# Patient Record
Sex: Male | Born: 1960 | Race: White | Hispanic: No | Marital: Married | Smoking: Never smoker
Health system: Southern US, Community
[De-identification: ages and names within clinical notes are randomized; demographics above are authoritative.]

## PROBLEM LIST (undated history)

## (undated) DIAGNOSIS — Z86010 Personal history of colonic polyps: Principal | ICD-10-CM

## (undated) DIAGNOSIS — D649 Anemia, unspecified: Secondary | ICD-10-CM

## (undated) DIAGNOSIS — K5790 Diverticulosis of intestine, part unspecified, without perforation or abscess without bleeding: Secondary | ICD-10-CM

## (undated) DIAGNOSIS — Z8739 Personal history of other diseases of the musculoskeletal system and connective tissue: Secondary | ICD-10-CM

## (undated) DIAGNOSIS — G709 Myoneural disorder, unspecified: Secondary | ICD-10-CM

## (undated) DIAGNOSIS — K648 Other hemorrhoids: Secondary | ICD-10-CM

## (undated) DIAGNOSIS — T7840XA Allergy, unspecified, initial encounter: Secondary | ICD-10-CM

## (undated) DIAGNOSIS — M009 Pyogenic arthritis, unspecified: Secondary | ICD-10-CM

## (undated) DIAGNOSIS — E119 Type 2 diabetes mellitus without complications: Secondary | ICD-10-CM

## (undated) DIAGNOSIS — Z5189 Encounter for other specified aftercare: Secondary | ICD-10-CM

## (undated) DIAGNOSIS — C801 Malignant (primary) neoplasm, unspecified: Secondary | ICD-10-CM

## (undated) DIAGNOSIS — S63279A Dislocation of unspecified interphalangeal joint of unspecified finger, initial encounter: Secondary | ICD-10-CM

## (undated) DIAGNOSIS — D369 Benign neoplasm, unspecified site: Secondary | ICD-10-CM

## (undated) DIAGNOSIS — N200 Calculus of kidney: Secondary | ICD-10-CM

## (undated) HISTORY — PX: KNEE ARTHROSCOPY W/ ACL RECONSTRUCTION: SHX1858

## (undated) HISTORY — DX: Personal history of colonic polyps: Z86.010

## (undated) HISTORY — DX: Encounter for other specified aftercare: Z51.89

## (undated) HISTORY — DX: Anemia, unspecified: D64.9

## (undated) HISTORY — DX: Diverticulosis of intestine, part unspecified, without perforation or abscess without bleeding: K57.90

## (undated) HISTORY — PX: KIDNEY STONE SURGERY: SHX686

## (undated) HISTORY — DX: Other hemorrhoids: K64.8

## (undated) HISTORY — DX: Benign neoplasm, unspecified site: D36.9

## (undated) HISTORY — PX: COLONOSCOPY: SHX174

## (undated) HISTORY — DX: Personal history of other diseases of the musculoskeletal system and connective tissue: Z87.39

## (undated) HISTORY — PX: KNEE ARTHROSCOPY: SUR90

## (undated) HISTORY — DX: Dislocation of unspecified interphalangeal joint of unspecified finger, initial encounter: S63.279A

## (undated) HISTORY — DX: Allergy, unspecified, initial encounter: T78.40XA

## (undated) HISTORY — DX: Type 2 diabetes mellitus without complications: E11.9

## (undated) HISTORY — DX: Malignant (primary) neoplasm, unspecified: C80.1

## (undated) HISTORY — DX: Myoneural disorder, unspecified: G70.9

## (undated) HISTORY — DX: Calculus of kidney: N20.0

## (undated) HISTORY — DX: Pyogenic arthritis, unspecified: M00.9

---

## 1998-06-12 ENCOUNTER — Ambulatory Visit (HOSPITAL_BASED_OUTPATIENT_CLINIC_OR_DEPARTMENT_OTHER): Admission: RE | Admit: 1998-06-12 | Discharge: 1998-06-12 | Payer: Self-pay | Admitting: Orthopaedic Surgery

## 1999-10-17 ENCOUNTER — Ambulatory Visit (HOSPITAL_COMMUNITY): Admission: RE | Admit: 1999-10-17 | Discharge: 1999-10-17 | Payer: Self-pay | Admitting: Gastroenterology

## 1999-10-17 ENCOUNTER — Encounter (INDEPENDENT_AMBULATORY_CARE_PROVIDER_SITE_OTHER): Payer: Self-pay | Admitting: Specialist

## 1999-10-17 DIAGNOSIS — Z860101 Personal history of adenomatous and serrated colon polyps: Secondary | ICD-10-CM

## 1999-10-17 DIAGNOSIS — Z86010 Personal history of colonic polyps: Secondary | ICD-10-CM

## 1999-10-17 HISTORY — DX: Personal history of adenomatous and serrated colon polyps: Z86.0101

## 1999-10-17 HISTORY — DX: Personal history of colonic polyps: Z86.010

## 2001-01-29 ENCOUNTER — Encounter: Admission: RE | Admit: 2001-01-29 | Discharge: 2001-01-29 | Payer: Self-pay | Admitting: Internal Medicine

## 2001-11-25 ENCOUNTER — Encounter (INDEPENDENT_AMBULATORY_CARE_PROVIDER_SITE_OTHER): Payer: Self-pay | Admitting: Specialist

## 2001-11-25 ENCOUNTER — Inpatient Hospital Stay (HOSPITAL_COMMUNITY): Admission: EM | Admit: 2001-11-25 | Discharge: 2001-11-26 | Payer: Self-pay | Admitting: Gastroenterology

## 2001-11-25 ENCOUNTER — Encounter: Payer: Self-pay | Admitting: Gastroenterology

## 2001-12-01 ENCOUNTER — Encounter: Payer: Self-pay | Admitting: Gastroenterology

## 2001-12-01 ENCOUNTER — Ambulatory Visit (HOSPITAL_COMMUNITY): Admission: RE | Admit: 2001-12-01 | Discharge: 2001-12-01 | Payer: Self-pay | Admitting: Gastroenterology

## 2003-10-30 ENCOUNTER — Other Ambulatory Visit: Admission: RE | Admit: 2003-10-30 | Discharge: 2003-10-30 | Payer: Self-pay | Admitting: Dermatology

## 2004-01-15 ENCOUNTER — Encounter: Admission: RE | Admit: 2004-01-15 | Discharge: 2004-01-15 | Payer: Self-pay | Admitting: Rheumatology

## 2004-12-23 ENCOUNTER — Emergency Department (HOSPITAL_COMMUNITY): Admission: EM | Admit: 2004-12-23 | Discharge: 2004-12-24 | Payer: Self-pay | Admitting: Emergency Medicine

## 2005-03-03 ENCOUNTER — Ambulatory Visit: Payer: Self-pay | Admitting: Gastroenterology

## 2005-03-18 ENCOUNTER — Ambulatory Visit: Payer: Self-pay | Admitting: Internal Medicine

## 2007-02-11 ENCOUNTER — Emergency Department (HOSPITAL_COMMUNITY): Admission: EM | Admit: 2007-02-11 | Discharge: 2007-02-11 | Payer: Self-pay | Admitting: Emergency Medicine

## 2007-02-23 ENCOUNTER — Ambulatory Visit (HOSPITAL_COMMUNITY): Admission: RE | Admit: 2007-02-23 | Discharge: 2007-02-23 | Payer: Self-pay | Admitting: Urology

## 2007-08-05 ENCOUNTER — Ambulatory Visit: Payer: Self-pay | Admitting: Internal Medicine

## 2009-03-26 ENCOUNTER — Ambulatory Visit: Payer: Self-pay | Admitting: Orthopedic Surgery

## 2009-03-26 DIAGNOSIS — S63279A Dislocation of unspecified interphalangeal joint of unspecified finger, initial encounter: Secondary | ICD-10-CM

## 2009-03-26 DIAGNOSIS — J45909 Unspecified asthma, uncomplicated: Secondary | ICD-10-CM | POA: Insufficient documentation

## 2009-03-26 HISTORY — DX: Dislocation of unspecified interphalangeal joint of unspecified finger, initial encounter: S63.279A

## 2010-01-30 ENCOUNTER — Encounter (INDEPENDENT_AMBULATORY_CARE_PROVIDER_SITE_OTHER): Payer: Self-pay | Admitting: *Deleted

## 2010-08-27 NOTE — Letter (Signed)
Summary: Colonoscopy Letter  [facility]  [address] [place], [place] [address]   Phone: [phone]  Fax: [phone]      January 30, 2010 MRN: [id number]   [facility] [address] [place], [place]  [address]   Dear Dennis Oliver,   According to your medical record, it is time for you to schedule a Colonoscopy. The American Cancer Society recommends this procedure as a method to detect early colon cancer. Patients with a family history of colon cancer, or a personal history of colon polyps or inflammatory bowel disease are at increased risk.  This letter has beeen generated based on the recommendations made at the time of your procedure. If you feel that in your particular situation this may no longer apply, please contact our office.  Please call our office at [phone] to schedule this appointment or to update your records at your earliest convenience.  Thank you for cooperating with [place] to provide you with the very best care possible.   Sincerely,  Hedwig Morton. Juanda Chance, M.D.  [facility] [phone]

## 2010-10-01 ENCOUNTER — Observation Stay (HOSPITAL_COMMUNITY)
Admission: RE | Admit: 2010-10-01 | Discharge: 2010-10-03 | Disposition: A | Discharge status: Home / Self Care | Payer: BC Managed Care – PPO | Source: Ambulatory Visit | Attending: Orthopaedic Surgery | Admitting: Orthopaedic Surgery

## 2010-10-01 ENCOUNTER — Ambulatory Visit (HOSPITAL_BASED_OUTPATIENT_CLINIC_OR_DEPARTMENT_OTHER)
Admission: RE | Admit: 2010-10-01 | Discharge: 2010-10-01 | Disposition: A | Discharge status: Other Acute Inpatient Hospital | Payer: BC Managed Care – PPO | Source: Ambulatory Visit | Attending: Orthopaedic Surgery | Admitting: Orthopaedic Surgery

## 2010-10-01 DIAGNOSIS — T8140XA Infection following a procedure, unspecified, initial encounter: Secondary | ICD-10-CM | POA: Insufficient documentation

## 2010-10-01 DIAGNOSIS — J45909 Unspecified asthma, uncomplicated: Secondary | ICD-10-CM | POA: Insufficient documentation

## 2010-10-01 DIAGNOSIS — D509 Iron deficiency anemia, unspecified: Secondary | ICD-10-CM | POA: Insufficient documentation

## 2010-10-01 DIAGNOSIS — Y838 Other surgical procedures as the cause of abnormal reaction of the patient, or of later complication, without mention of misadventure at the time of the procedure: Secondary | ICD-10-CM | POA: Insufficient documentation

## 2010-10-01 DIAGNOSIS — M109 Gout, unspecified: Secondary | ICD-10-CM | POA: Insufficient documentation

## 2010-10-01 DIAGNOSIS — M009 Pyogenic arthritis, unspecified: Secondary | ICD-10-CM | POA: Insufficient documentation

## 2010-10-01 DIAGNOSIS — K649 Unspecified hemorrhoids: Secondary | ICD-10-CM | POA: Insufficient documentation

## 2010-10-01 DIAGNOSIS — Z01812 Encounter for preprocedural laboratory examination: Secondary | ICD-10-CM | POA: Insufficient documentation

## 2010-10-01 LAB — CBC
HCT: 43.7 % (ref 39.0–52.0)
Hemoglobin: 14.9 g/dL (ref 13.0–17.0)
MCH: 29.2 pg (ref 26.0–34.0)
MCHC: 34.1 g/dL (ref 30.0–36.0)
MCV: 85.5 fL (ref 78.0–100.0)
Platelets: 330 10*3/uL (ref 150–400)
RBC: 5.11 MIL/uL (ref 4.22–5.81)
RDW: 14 % (ref 11.5–15.5)
WBC: 13.9 10*3/uL — ABNORMAL HIGH (ref 4.0–10.5)

## 2010-10-01 LAB — BASIC METABOLIC PANEL
BUN: 17 mg/dL (ref 6–23)
Calcium: 9.6 mg/dL (ref 8.4–10.5)
Creatinine, Ser: 0.98 mg/dL (ref 0.4–1.5)
GFR calc non Af Amer: >60 mL/min (ref >60)

## 2010-10-01 LAB — SURGICAL PCR SCREEN: Staphylococcus aureus: NEGATIVE

## 2010-10-01 LAB — SYNOVIAL CELL COUNT + DIFF, W/ CRYSTALS
Crystals, Fluid: NONE SEEN
Lymphocytes-Synovial Fld: 3 % (ref 0–20)
Monocyte-Macrophage-Synovial Fluid: 3 % — ABNORMAL LOW (ref 50–90)

## 2010-10-02 ENCOUNTER — Observation Stay (HOSPITAL_COMMUNITY): Payer: BC Managed Care – PPO

## 2010-10-02 LAB — DIFFERENTIAL
Basophils Absolute: 0 10*3/uL (ref 0.0–0.1)
Eosinophils Relative: 1 % (ref 0–5)
Lymphocytes Relative: 18 % (ref 12–46)
Neutro Abs: 8.7 10*3/uL — ABNORMAL HIGH (ref 1.7–7.7)
Neutrophils Relative %: 70 % (ref 43–77)

## 2010-10-02 LAB — CBC
HCT: 41 % (ref 39.0–52.0)
RDW: 14.3 % (ref 11.5–15.5)
WBC: 12.5 10*3/uL — ABNORMAL HIGH (ref 4.0–10.5)

## 2010-10-05 LAB — BODY FLUID CULTURE

## 2010-10-06 LAB — ANAEROBIC CULTURE

## 2010-10-08 ENCOUNTER — Encounter: Payer: Self-pay | Admitting: Internal Medicine

## 2010-10-09 NOTE — Op Note (Signed)
Dennis Oliver, Dennis Oliver                ACCOUNT NO.:  [id number]  MEDICAL RECORD NO.:  [id number]           PATIENT TYPE:  I  LOCATION:  5006                         FACILITY:  [facility]  PHYSICIAN:  Lubertha Basque. Leanord Thibeau, M.D.DATE OF BIRTH:  [birth year]  DATE OF PROCEDURE:  10/01/2010 DATE OF DISCHARGE:                              OPERATIVE REPORT   PREOPERATIVE DIAGNOSIS:  Right knee infection versus gout.  POSTOPERATIVE DIAGNOSIS:  Right knee infection versus gout.  PROCEDURE:  Right knee irrigation and debridement.  ANESTHESIA:  General.  ATTENDING SURGEON:  Lubertha Basque. Jerl Santos, MD  ASSISTANT:  Lindwood Qua, PA   INDICATIONS FOR PROCEDURE:  The patient is a 50 year old man who is 4 weeks from a knee arthroscopy.  He did well until about 10 days back when he developed some swelling and warmth to the knee.  He does have a history of gout and is on medication for this condition, but we were concerned that he either had a flare of his gout versus perhaps an infection.  Cell count has risen from 48,000-49,000 and recently to 58,000, and he had continued difficulty despite several aspirations.  We elected to go forward with irrigation and debridement for either gout or an infection.  Informed operative consent was obtained after discussion of possible complications including reaction to anesthesia and obviously infection or gout.  SUMMARY OF FINDINGS AND PROCEDURE:  Under general anesthesia through his 2 old portals, an arthroscopy of the right knee was performed.  Findings were identical what they were a month back with some significant degenerative change with medial and patellofemoral.  There were no new meniscal issues.  There no new erosions of the cartilage.  The fluid which came from the knee was light grain and seemed consistent with gout and was again sent to the lab for cell count and crystal study.  We also sent more cultures, though 3 sets have been no growth thus far.  We  did perform thorough irrigation with about 10 liters of fluid, the last liter of which was antibiotic solution.  We placed 2 drains and he was admitted to the hospital.  DESCRIPTION OF PROCEDURE:  The patient was taken to operating suite where general anesthetic was applied without difficulty.  He was positioned supine and prepped and draped in normal sterile fashion. After administration of IV vancomycin, an arthroscopy of the right knee was formed through 2 portals.  Findings were as noted above and procedure consisted of thorough irrigation and debridement with 9 liters of saline followed by 1 liter of antibiotic solution.  We placed 2 drains in the portals.  Adaptic was applied followed by dry gauze and loose Ace wrap.  Estimated blood loss and fluid obtained from anesthesia records.  No tourniquet was placed.  DISPOSITION:  The patient was extubated in the operating room and taken to recovery room in stable condition.  He was to be admitted for appropriate postop care to include vancomycin which will be adjusted pending lab results and Infectious Disease consult.     Lubertha Basque Jerl Santos, M.D.     PGD/MEDQ  D:  10/01/2010  T:  10/02/2010  Job:  [id number]  Electronically Signed by Marcene Corning M.D. on 10/09/2010 11:59:29 AM

## 2010-10-09 NOTE — Discharge Summary (Signed)
Dennis Oliver, Dennis Oliver                ACCOUNT NO.:  [id number]  MEDICAL RECORD NO.:  [id number]           PATIENT TYPE:  I  LOCATION:  5006                         FACILITY:  [facility]  PHYSICIAN:  Lubertha Basque. Enaya Howze, M.D.DATE OF BIRTH:  Dec 17, 1960  DATE OF ADMISSION:  10/01/2010 DATE OF DISCHARGE:  10/03/2010                              DISCHARGE SUMMARY   ADMITTING DIAGNOSES: 1. Status post arthroscopy with right knee infection. 2. History of gout. 3. History of allergies. 4. History of iron deficiency anemia. 5. History of asthma. 6. History of hemorrhoids.  DISCHARGE DIAGNOSES: 1. Status post arthroscopy with right knee infection. 2. History of gout. 3. History of allergies. 4. History of iron deficiency anemia. 5. History of asthma. 6. History of hemorrhoids.  OPERATIONS:  Arthroscopic I and D and placement of PICC line for right knee infection.  BRIEF HISTORY:  Mr. Matters is a patient well known to our practice. 50 years old, previously within the last few weeks has had a knee arthroscopy and then was having postoperatively after 3 weeks of increasing discomfort or pain and it was suspected that he had possibly a gout attack and in then the office on two occasions we had drawn fluid off and sent it to the lab for cell count which initially was about 49,000 and then raised to 59,000 but no crystals were seen, and it was felt that because of increasing pain and despite being on p.o. antibiotics and his gout medicine, Indocin, and had little relief from that it was best to bring him into the hospital and do an I and D and get an Infectious Disease consult.  PERTINENT LABORATORY DATA AND X-RAY FINDINGS:  CBC, hemoglobin 13.9, WBCs 12.5 that were down from 13.9, platelets of 312,000.  Chem-7, sodium 140, potassium 5.0, BUN 17, creatinine 0.98, and glucose 97.  COURSE IN THE HOSPITAL:  He was admitted postoperatively, started on vancomycin protocol per pharmacy.  He had two  large-bore drains placed in his right knee to Hemovac drainage.  He is on a regular diet, knee- high TEDs, incentive spirometry, and then may be weightbearing as tolerated and out of bed.  We had also ordered an Infectious Disease consult by Dr. Orvan Falconer who made the recommendations of staying on vancomycin for [redacted] weeks along with p.o. Cipro 750 one p.o. b.i.d. for the length of time as well.  His vital signs were stable in the hospital.  A PICC line was placed on the floor and arrangements for home health for IV antibiotic therapy through [facility] were arranged and he was discharged home.  CONDITION ON DISCHARGE:  Improved.  FOLLOWUP:  He will be on two antibiotics, vancomycin and Cipro, for total of 3 weeks.  He may eat a low-sodium, heart-healthy diet. Weightbearing as tolerated.  May do range of motion with his knee,  I have advised him to keep it dry.  Return to Dr. Nolon Nations office in 1 week calling [phone] for a return appointment.  Any sign of infection, increasing pain, drainage from his knee to call us immediately.  We would be happy to see him.  He can change his dressing daily as well.     Lindwood Qua, P.A.   ______________________________ Lubertha Basque. Jerl Santos, M.D.    MC/MEDQ  D:  10/03/2010  T:  10/04/2010  Job:  [id number]  Electronically Signed by Lindwood Qua P.A. on 10/06/2010 08:41:39 AM Electronically Signed by Marcene Corning M.D. on 10/09/2010 11:59:34 AM

## 2010-10-15 ENCOUNTER — Emergency Department (HOSPITAL_COMMUNITY): Payer: BC Managed Care – PPO

## 2010-10-15 ENCOUNTER — Emergency Department (HOSPITAL_COMMUNITY)
Admission: EM | Admit: 2010-10-15 | Discharge: 2010-10-15 | Disposition: A | Discharge status: Home / Self Care | Payer: BC Managed Care – PPO | Attending: Emergency Medicine | Admitting: Emergency Medicine

## 2010-10-15 DIAGNOSIS — R55 Syncope and collapse: Secondary | ICD-10-CM | POA: Insufficient documentation

## 2010-10-15 DIAGNOSIS — J984 Other disorders of lung: Secondary | ICD-10-CM | POA: Insufficient documentation

## 2010-10-15 DIAGNOSIS — R42 Dizziness and giddiness: Secondary | ICD-10-CM | POA: Insufficient documentation

## 2010-10-15 LAB — POCT I-STAT, CHEM 8
BUN: 11 mg/dL (ref 6–23)
Chloride: 106 mEq/L (ref 96–112)
HCT: 35 % — ABNORMAL LOW (ref 39.0–52.0)
Sodium: 141 mEq/L (ref 135–145)

## 2010-10-15 MED ORDER — IOHEXOL 300 MG/ML  SOLN
100.0000 mL | Freq: Once | INTRAMUSCULAR | Status: AC | PRN
  Administered 2010-10-15: 100 mL via INTRAVENOUS

## 2010-10-15 NOTE — Medication Information (Signed)
Summary: [facility]: RX  [facility]: RX   Imported By: Florinda Marker 10/09/2010 15:42:55  _____________________________________________________________________  External Attachment:    Type:   Image     Comment:   External Document

## 2010-10-16 ENCOUNTER — Telehealth: Payer: Self-pay | Admitting: *Deleted

## 2010-10-16 NOTE — Telephone Encounter (Signed)
rec'd message that he was taken to [facility] ED yesterday via EMS because he fainted. States he was diagnoses with dehydration. He is on IV Vanc at home. Xrays showed he had a nodule on his lung, wants to know what to do next. Asked that Dr. Orvan Falconer let him know. [phone]. I called & spoke with him. Told him I will send this to his md. His next appt is in early April. States he was sitting at the time. No injuries. Feels better today. Encouraged him to drink plenty of fluids.Faustino Congress

## 2010-10-22 NOTE — Telephone Encounter (Signed)
RN called the home.  Message left for pt. To call the Center if he is still having concerns.  Patient does have a [facility] appt next week.  Continuing IV vancomycin.  Jennet Maduro, RN

## 2010-10-29 ENCOUNTER — Encounter: Payer: Self-pay | Admitting: Internal Medicine

## 2010-10-29 ENCOUNTER — Ambulatory Visit (INDEPENDENT_AMBULATORY_CARE_PROVIDER_SITE_OTHER): Payer: BC Managed Care – PPO | Admitting: Internal Medicine

## 2010-10-29 DIAGNOSIS — M009 Pyogenic arthritis, unspecified: Secondary | ICD-10-CM

## 2010-10-29 DIAGNOSIS — J45909 Unspecified asthma, uncomplicated: Secondary | ICD-10-CM | POA: Insufficient documentation

## 2010-10-29 DIAGNOSIS — J309 Allergic rhinitis, unspecified: Secondary | ICD-10-CM | POA: Insufficient documentation

## 2010-10-29 DIAGNOSIS — K648 Other hemorrhoids: Secondary | ICD-10-CM

## 2010-10-29 DIAGNOSIS — N200 Calculus of kidney: Secondary | ICD-10-CM

## 2010-10-29 DIAGNOSIS — M109 Gout, unspecified: Secondary | ICD-10-CM | POA: Insufficient documentation

## 2010-10-29 HISTORY — DX: Pyogenic arthritis, unspecified: M00.9

## 2010-10-29 NOTE — Assessment & Plan Note (Signed)
He is doing better but he will take more time to determine if this process is cured. I will continue observation off of antibiotics. He knows to call me if he has signs of relapse before his next visit.

## 2010-10-29 NOTE — Progress Notes (Signed)
  Subjective:    Patient ID: Dennis Oliver, male    DOB: August 19, 1960, 50 y.o.   MRN: [id number]  HPI Mr. Lovering is in for his routine hospital followup visit. He is a 50 year old who underwent an uneventful meniscectomy on his right knee in February. 15 days postoperatively he developed fever and painful swelling of his right knee. He underwent arthrocentesis on 3 occasions which was said to yield yellow fluid. I was able to locate 2 specimens at [facility]. The first specimen on February 26 at 47,000 white blood cells. A Gram stain and culture were negative. I told this was done before he was put on empiric Keflex. A second specimen on March 2 showed 59,610 white blood cells with 90% being segmented neutrophils. There were no crystals seen and again Gram stain and culture were negative. He was treated with indomethacin for possible acute flare of gout but did not improve and then was switched to empiric Keflex. He felt like his fever improved but he noticed no change in the pain or swelling in his knee so he was admitted to the hospital on March 6 for incision and drainage. The operative synovial fluid specimen showed 43,100 white blood cells with 94% segmented neutrophils. No crystals were seen in the Gram stain and culture were again negative. I elected to treat him empirically for probable culture-negative septic arthritis with IV vancomycin and oral ciprofloxacin he completed 3 weeks of therapy 6 days ago and had his PICC removed. Overall he is feeling much better. The pain and swelling in his right knee have improved and he is back at work part-time. He is started physical therapy. He still feels like he has some very low-grade fevers and feels like his resting heart rate it is up slightly.   Review of Systems     Objective:   Physical Exam  [vitalsreviewed. Cardiovascular: Normal rate and regular rhythm.   No murmur heard. Pulmonary/Chest: Effort normal and breath sounds normal. No respiratory  distress.  Musculoskeletal:       Right knee: He exhibits swelling. He exhibits no erythema. no tenderness found.          Assessment & Plan:

## 2010-11-28 ENCOUNTER — Encounter: Payer: Self-pay | Admitting: Internal Medicine

## 2010-12-03 ENCOUNTER — Ambulatory Visit: Payer: BC Managed Care – PPO | Admitting: Internal Medicine

## 2010-12-09 ENCOUNTER — Encounter: Payer: Self-pay | Admitting: Internal Medicine

## 2010-12-10 NOTE — Op Note (Signed)
Dennis Oliver, Dennis Oliver                ACCOUNT NO.:  [id number]   MEDICAL RECORD NO.:  [id number]          PATIENT TYPE:  AMB   LOCATION:  DAY                          FACILITY:  [facility]   PHYSICIAN:  Sigmund I. Patsi Sears, M.D.DATE OF BIRTH:  [birth year]   DATE OF PROCEDURE:  02/23/2007  DATE OF DISCHARGE:                               OPERATIVE REPORT   PREOPERATIVE DIAGNOSES:  Left distal ureteral calculus, with non  progression, and colic.   POSTOPERATIVE DIAGNOSES:  Left distal ureteral calculus, with non  progression, and colic.   OPERATION:  Cystourethroscopy, left retrograde pyelogram with  interpretation, ureteroscopy, basket extraction left ureteral stone,  left double-J catheter (6-French x 26 cm).   SURGEON:  Dr. Patsi Sears.   ANESTHESIA:  General LMA.   PREPARATION:  After appropriate preanesthesia, the patient is brought to  the operating room and placed on the operating table in dorsal supine  position where general LMA anesthesia was introduced.  He was then  replaced in the dorsal lithotomy position where the pubis was prepped  with Betadine solution and draped in the usual fashion.   HISTORY:  This 49 year old male is known to have a 4.5 mm left distal  ureteral stone, which he has been unable to pass, which has caused  recurrent ureteral colic with nausea, vomiting and flank pain.  The  patient is now for basket extraction.   PROCEDURE:  Cystoscopy was accomplished, which showed a normal-appearing  bladder, but a very edematous left ureteral orifice.  Retrograde  pyelogram was performed which showed a stone in the intramural portion  of the ureter.  Ureteroscopy was performed, which showed the stone,  which was irregularly shaped, in the distal ureter.  With some effort, a  basket was placed around the ureter, and the stone was finally  extracted.  Repeat ureteroscopy revealed no other stone but there was  much edema, and bleeding in the lower ureter.  It was  elected to pass a  double-J catheter, and therefore over the previously placed safety wire,  a 6-French x 26 cm double-J catheter was passed without difficulty,  coiled in the kidney, and in the bladder.  The patient tolerated the  well.  He was given 30 mg of IV Toradol prior to awakening, and was  given IV Ancef for the procedure.  He was then awakened and taken to the  recovery room in good condition.      Sigmund I. Patsi Sears, M.D.  Electronically Signed     SIT/MEDQ  D:  02/23/2007  T:  02/24/2007  Job:  [id number]

## 2010-12-10 NOTE — Assessment & Plan Note (Signed)
[facility]                         GASTROENTEROLOGY OFFICE NOTE   Dennis Oliver, Dennis Oliver                       MRN:          [id number]  DATE:08/05/2007                            DOB:          1961-07-06    Mr. Asbridge is a 50 year old gentleman with symptomatic hemorrhoids.  He  has been followed by Dr. Victorino Dike.  I have done his last colonoscopy  in August 2006, this was a normal exam except for internal and external  hemorrhoids which were bleeding intermittently.  He has a positive  family history of colon cancer in his mother and has been having  colonoscopies every 5 years.  He is due for next colonoscopy in August  2010 or 2011.  He had a tubular adenoma on a colonoscopy in 2001.  Patient is here today because of symptomatic hemorrhoids.  He has a  prolapsing hemorrhoid which he has to push back, is complicated by  leakage of stool and irritation.  The irritation is controlled with  Analpram cream 2.5% which he uses on p.r.n. basis.  His bowel habits are  much improved on high fiber diet and fiber supplements.   MEDICATIONS:  Advair, Effexor, Allegra and allopurinol.   PHYSICAL EXAMINATION:  Blood pressure 112/76, pulse 68, weight 257  pounds.  Patient was somewhat overweight.  ABDOMINAL:  Exam was unremarkable.  Rectal and anoscopic exam reveals external hemorrhoidal tags, not  active.  No prolapsed issue.  Rectal tone was normal.  There were rather  large internal hemorrhoids, one of them protruding into the lumen.  None  of them were bleeding.  Stool was Hemoccult negative.  At least 3  strains of internal hemorrhoids were noted, they were bluish and reddish  in discoloration.   IMPRESSION:  Symptomatic first grade hemorrhoids with one second degree  hemorrhoid in the anal canal, no evidence of bleeding.   PLAN:  Continue Analpram cream.  I have offered hemorrhoidal banding,  patient is going to think about it and will let us know in the  next few  months if he is interested in the hemorrhoidal banding as an outpatient.     Hedwig Morton. Juanda Chance, MD  Electronically Signed    DMB/MedQ  DD: 08/05/2007  DT: 08/05/2007  Job #: [id number]

## 2010-12-13 NOTE — H&P (Signed)
[facility]  Patient:    Dennis Oliver, Dennis Oliver Visit Number: [id number] MRN: [id number]          Service Type: MED Location: 3W [phone] Attending Physician:  Starr Sinclair Dictated by:   Mike Gip, P.A.C. Admit Date:  11/25/2001 Discharge Date: 11/26/2001   CC:         Louanna Raw, M.D.   History and Physical  CHIEF COMPLAINT:  Weakness, dizziness, and rectal bleeding.  HISTORY OF PRESENT ILLNESS:  The patient is a 50 year old white male known to Dr. Victorino Dike who has a family history of colon cancer in his mother who was diagnosed in her early 60s and was deceased at 8.  The patient himself has a history of adenomatous colon polyps and had colonoscopy in 1989, 1995, and in 2001.  At the time of last colonoscopy in 2001, he did have an adenomatous polyp and external hemorrhoids.  He was to have follow-up in two years.  At this time he presents with a six- to eight-week history of "lethargy" with generalized decrease in energy level.  He then developed an episode of lightheadedness in the barber chair about a week ago and then became lightheaded and dizzy at a funeral last weekend.  Yesterday he again became dizzy, lightheaded, and had some clamminess.  He has not had any associated nausea or vomiting.  Has no complaint of abdominal pain, shortness of breath, or chest pain.  He has had some indigestion recently and has been using over-the-counter Zantac.  He denies any dysphagia or odynophagia.  He does use Advil on a regular basis, though he says not every day but sometimes up to 8-12 per day.  No history of melena noted by the patient but has been seeing blood with his bowel movements, he says, at least 85% of the time over the past year or so, usually red blood noted on the tissue or in the commode and occasionally with small clots on the stools.  His bowel habits have been quite regular.  The patient was seen by Dr. Louanna Raw at  Urgent Care yesterday, noted to be heme-positive.  CBC yesterday on November 24, 2001, showed a WBC of 8.7, hemoglobin 8.6, hematocrit 26.3, MCV of 58, platelets 373.  Glucose was 71. The patient was referred to our office and added on today.  Noted to have brown stool, perhaps trace positive, hemoglobin stable at 8.7, and a benign abdominal exam.  However, the patient appears to be quite symptomatic.  Also was noted to have an irregular heart rate and is admitted at this time for transfusions, telemetry monitoring, bowel prep, and then plans for colonoscopy and endoscopy on Nov 26, 2001.  CURRENT MEDICATIONS: 1. Zantac over-the-counter p.r.n. 2. Advair q.d.  He is uncertain of the dosage. 3. Allegra 1 p.o. q.d. 4. Advil 6-12 q.d., though not every day.  ALLERGIES:  No known drug allergies.  PAST MEDICAL HISTORY: 1. Asthma. 2. Arthritis. 3. Prior ACL repair on right knee. 4. History of adenomatous colon polyps.  FAMILY HISTORY:  Mother with breast CA diagnosed in her 90s, then later developed colon cancer, and deceased in her early 67s.  Maternal grandmother with history of leukemia.  SOCIAL HISTORY:  The patient is married.  He is employed in [employer].  He is a nonsmoker, though does smoke marijuana occasionally.  No daily ETOH.  He describes moderate ETOH use.  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  REVIEW OF SYSTEMS:  CARDIOVASCULAR:  Denies any chest pain or anginal symptoms.  PULMONARY:  Denies any cough, shortness of breath, or sputum production.  GENITOURINARY:  Denies any dysuria, urgency, or frequency. MUSCULOSKELETAL:  Pertinent for various joint aches.  PHYSICAL EXAMINATION:  GENERAL:  Well-developed white male in no acute distress.  He is pale.  SKIN:  Warm and dry.  VITAL SIGNS:  Afebrile.  Blood pressure 150/80, pulse in the 80s and irregular with lying.  Weight 268.  HEENT:  Pale, otherwise unremarkable.  CARDIOVASCULAR:  Irregular rate  and rhythm with S1 and S2.  There is no murmur, rub, or gallop.  LUNGS:  Clear to A&P.  ABDOMEN:  Soft and nontender.  There is no palpable mass or hepatosplenomegaly.  RECTAL:  Trace brown stool.  No mass.  Perhaps trace positive.  External hemorrhoids noted, not particularly friable, and no evidence of thrombosis.  EXTREMITIES:  No clubbing, cyanosis, or edema.  IMPRESSION: 1. The patient is a 50 year old white male with symptomatic microcytic anemia    with weakness, lightheadedness, and chronic hematochezia.  Question all    secondary to hemorrhoidal bleeding.  Rule out occult colon lesion or    perhaps upper gastrointestinal lesion with chronic nonsteroidal    anti-inflammatory drug use. 2. Irregular heart rate.  Rule out arrhythmia. 3. History of adenomatous polyps and external hemorrhoids. 4. Family history of colon cancer in the patients mother. 5. Asthma.  PLAN:  The patient is admitted to the service of Dr. Claudette Head for IV fluid hydration, telemetry monitoring, baseline laboratories.  He will be transfused two units of packed rbcs and then prepped for colonoscopy and endoscopy to be done on Nov 26, 2001.  For details, see the orders. Dictated by:   Mike Gip, P.A.C. Attending Physician:  Starr Sinclair DD:  11/25/01 TD:  11/26/01 Job: [id number]

## 2010-12-13 NOTE — Discharge Summary (Signed)
[facility]  Patient:    Dennis Oliver, Dennis Oliver Visit Number: [id number] MRN: [id number]          Service Type: MED Location: 3W [phone] Attending Physician:  Starr Sinclair Dictated by:   Sammuel Cooper, P.A. Admit Date:  11/25/2001 Discharge Date: 11/26/2001   CC:         Louanna Raw, M.D., urgent care   Discharge Summary  ADMITTING DIAGNOSES: 50. A 50 year old white male with symptomatic microcytic anemia with weakness,    lightheadedness, and chronic hematochezia, question all secondary to    hemorrhoidal bleeding, rule out occult colon lesion and perhaps upper    gastrointestinal lesion with a history of chronic nonsteroidal    anti-inflammatory drug use. 2. Irregular heart rate, rule out arrhythmia. 3. History of adenomatous polyps and external hemorrhoids. 4. Positive family history of colon cancer in patients mother. 5. Asthma.  DISCHARGE DIAGNOSES: 1. Severe iron deficiency anemia with acute and chronic hematochezia and    bleeding felt secondary to internal hemorrhoids now status post saline    injection of internal hemorrhoids. 2. Colon polyps. 3. Moderate esophagitis and hiatal hernia. 4. Irregular heart rate, rule out arrhythmia. 5. History of adenomatous polyps and external hemorrhoids. 4. Positive family history of colon cancer in patients mother. 5. Asthma.  CONSULTATIONS:  None.  PROCEDURES:  Upper endoscopy and colonoscopy per Dr. Claudette Head with polypectomy and small bowel biopsy.  HISTORY OF PRESENT ILLNESS:  Dennis Oliver is a pleasant 50 year old white male known to Dr. Terrial Rhodes with a family history of colon cancer in his mother who was diagnosed in her early 7s and died at 61.  Patient himself has a history of adenomatous colon polyps and had colonoscopy done in 1989, 1995, and again in 2001.  at the time of last colonoscopy, he was noted to have adenomatous polyps and external hemorrhoids and was to have  follow-up in two years.  At this time, he presents with a six to eight week history of lethargy and general decrease in energy level.  He had an episode of light-headedness while sitting in a barber chair about a week prior to presentation and then said he had another episode this past weekend with dizziness and light-headedness as well as diaphoresis while he was at a funeral.  He had had no associated chest pain, shortness of breath, nausea, or vomiting, no complaints of abdominal pain.  He says he has been having some indigestion and had been using over-the-counter Zantac.  He has also been using Advil on a regular basis, sometimes up to 8 to 12 per day.  He has been unaware of any melena but says that he has been seeing red blood with his bowel movements at least 85% of the time over the past year or so.  He says, usually, he sees red blood on the tissue or in the commode and occasionally has some small clotting noted on his stools.  He says his bowel habits have been quite regular and he has attributed the bleeding to hemorrhoids.  Patient had seen Dr. Louanna Raw at urgent care the day prior to admission and was noted to be heme positive and hemoglobin of 8.6.  She was referred to our office for further evaluation, was seen as an add-on, noted to have trace-positive brown stool, hemoglobin stable at 8.7, and a benign abdominal exam.  However, the patient appeared to be quite symptomatic with tachycardia, irregular heart rate, and continued complaints of  dizziness and light-headedness and was therefore admitted to the hospital for further diagnostic evaluation, transfusions, etc.  LABORATORY STUDIES:  On Nov 25, 2001, hemoglobin 8.6, hematocrit 27.6.  On May 2, hemoglobin 9.8, hematocrit 30.2, pro time 13.2, INR 1, PTT 27, electrolytes within normal limits, BUN 9, creatinine 1.0, albumin 3.9.  Liver function studies normal.  Serum iron 11, TIBC of 44, and saturation of 2.  EKG  on admission showed a normal sinus rhythm.  HOSPITAL COURSE:  The patient was admitted to the service of Dr. Claudette Head who was covering on call.  An IV was placed.  Baseline labs were obtained. Due to his significant symptoms with his current hemoglobin level, he was transfused two units of packed RBCs.  He was also scheduled for a colonoscopy and upper endoscopy with Dr. Russella Dar the following day and had colon prep that evening which he tolerated well.  On colonoscopy, he was found to have internal hemorrhoids and a diminutive transverse colon polyp which was removed.  He had a saline injection of his internal hemorrhoids.  On EGD, he was noted to have moderate esophagitis, a 3 cm hiatal hernia.  The remainder of the exam was normal.  He did have small bowel biopsies taken due to his iron deficiency to rule out any evidence of sprue.  Biopsies are pending at the time of this dictation.  Patient was felt to be stable and without any evidence for active hemorrhage, was discharged to home on May 2 with instructions to follow up with Dr. Terrial Rhodes on May 16 at 2:15 p.m. and to call for any problems in the interim.  He was to take it easy at home with no strenuous exercise, lifting, etc.  He was to maintain a low-residue diet for 10 days and then regular diet thereafter.  MEDICATIONS:  Patient advised to give any aspirin or NSAIDs for at least two weeks and was advised that he could take Tylenol if necessary.  He was a given a prescription for Darvocet-N 100 one every 6 hours if needed for pain due to hemorrhoid injection, Advair as previous, Allegra 60 mg q.d. as previous, Protonix 40 mg p.o. q.a.m., Colace 100 mg q.d. for two weeks and Anusol HC suppositories one per rectum q.h.s. for two to three weeks and then p.r.n. thereafter.  Patient was also scheduled for a small bowel follow-through for completeness due to significant iron deficiency on Wednesday, May 7, at [facility] at  8 a.m.  He was also given a prescription for Nu-Iron one p.o. b.i.d. with plans  for follow-up iron studies in three months.  CONDITION ON DISCHARGE:  Stable. Dictated by:   Sammuel Cooper, P.A. Attending Physician:  Starr Sinclair DD:  11/30/01 TD:  12/02/01 Job: [id number]

## 2011-04-03 ENCOUNTER — Encounter: Payer: Self-pay | Admitting: Internal Medicine

## 2011-04-18 ENCOUNTER — Ambulatory Visit (AMBULATORY_SURGERY_CENTER): Payer: BC Managed Care – PPO | Admitting: *Deleted

## 2011-04-18 ENCOUNTER — Encounter: Payer: Self-pay | Admitting: Internal Medicine

## 2011-04-18 DIAGNOSIS — Z1211 Encounter for screening for malignant neoplasm of colon: Secondary | ICD-10-CM

## 2011-04-18 DIAGNOSIS — Z86010 Personal history of colonic polyps: Secondary | ICD-10-CM

## 2011-04-18 DIAGNOSIS — Z800 Family history of malignant neoplasm of digestive organs: Secondary | ICD-10-CM

## 2011-04-18 MED ORDER — PEG-KCL-NACL-NASULF-NA ASC-C 100 G PO SOLR: ORAL | Status: DC

## 2011-05-01 ENCOUNTER — Telehealth: Payer: Self-pay | Admitting: *Deleted

## 2011-05-01 ENCOUNTER — Encounter: Payer: Self-pay | Admitting: Internal Medicine

## 2011-05-01 ENCOUNTER — Ambulatory Visit (AMBULATORY_SURGERY_CENTER): Payer: BC Managed Care – PPO | Admitting: Internal Medicine

## 2011-05-01 VITALS — BP 133/73 | HR 62 | Temp 97.7°F | Resp 20 | Ht 71.0 in | Wt 249.0 lb

## 2011-05-01 DIAGNOSIS — R911 Solitary pulmonary nodule: Secondary | ICD-10-CM

## 2011-05-01 DIAGNOSIS — D126 Benign neoplasm of colon, unspecified: Secondary | ICD-10-CM

## 2011-05-01 DIAGNOSIS — Z86010 Personal history of colonic polyps: Secondary | ICD-10-CM

## 2011-05-01 DIAGNOSIS — Z800 Family history of malignant neoplasm of digestive organs: Secondary | ICD-10-CM

## 2011-05-01 DIAGNOSIS — Z1211 Encounter for screening for malignant neoplasm of colon: Secondary | ICD-10-CM

## 2011-05-01 MED ORDER — SODIUM CHLORIDE 0.9 % IV SOLN: 500.0000 mL | INTRAVENOUS | Status: DC

## 2011-05-01 NOTE — Telephone Encounter (Signed)
Per Dr. Juanda Chance, patient needs CT chest IV contrast only- f/u pulmonary nodule on CT scan from 10/15/10. Scheduled for CT at [facility] CT on 05/12/11 at 1:00 PM. NPO 2 hours prior.Okey Dupre)

## 2011-05-01 NOTE — Telephone Encounter (Signed)
Spoke with patient's wife and gave her the date and time of CT.

## 2011-05-01 NOTE — Patient Instructions (Signed)
FOLLOW DISCHARGE INSTRUCTIONS (BLUE & GREEN SHEETS).    INFORMATION ON POLYPS & HIGH FIBER DIET GIVEN TO YOU.    CT SCAN OF CHEST ( UNRELATED TO TODAYS VISIT) TO BE ORDERED BY DR. Regino Schultze NURSE FROM 3RD FLOOR.

## 2011-05-02 ENCOUNTER — Telehealth: Payer: Self-pay

## 2011-05-02 NOTE — Telephone Encounter (Signed)

## 2011-05-07 ENCOUNTER — Encounter: Payer: Self-pay | Admitting: Internal Medicine

## 2011-05-12 ENCOUNTER — Ambulatory Visit (INDEPENDENT_AMBULATORY_CARE_PROVIDER_SITE_OTHER)
Admission: RE | Admit: 2011-05-12 | Discharge: 2011-05-12 | Disposition: A | Discharge status: Home / Self Care | Payer: BC Managed Care – PPO | Source: Ambulatory Visit | Attending: Internal Medicine | Admitting: Internal Medicine

## 2011-05-12 DIAGNOSIS — J984 Other disorders of lung: Secondary | ICD-10-CM

## 2011-05-12 DIAGNOSIS — R911 Solitary pulmonary nodule: Secondary | ICD-10-CM

## 2011-05-12 LAB — CBC
MCHC: 34.8
MCV: 87.2
Platelets: 231
RBC: 5.34
RDW: 14.8 — ABNORMAL HIGH

## 2011-05-12 LAB — BASIC METABOLIC PANEL
BUN: 13
CO2: 25
Calcium: 9.3
Chloride: 104
Creatinine, Ser: 1.08
Glucose, Bld: 110 — ABNORMAL HIGH

## 2011-05-12 LAB — URINALYSIS, ROUTINE W REFLEX MICROSCOPIC
Bilirubin Urine: NEGATIVE
Ketones, ur: NEGATIVE
Leukocytes, UA: NEGATIVE
Nitrite: NEGATIVE
Protein, ur: NEGATIVE
Urobilinogen, UA: 0.2

## 2011-05-12 LAB — DIFFERENTIAL
Basophils Absolute: 0
Basophils Relative: 1
Eosinophils Absolute: 0.1
Monocytes Relative: 6
Neutrophils Relative %: 75

## 2011-05-12 LAB — HEMOGLOBIN AND HEMATOCRIT, BLOOD: HCT: 41.8

## 2011-05-12 MED ORDER — IOHEXOL 300 MG/ML  SOLN
80.0000 mL | Freq: Once | INTRAMUSCULAR | Status: AC | PRN
  Administered 2011-05-12: 80 mL via INTRAVENOUS

## 2011-05-13 ENCOUNTER — Telehealth: Payer: Self-pay | Admitting: Internal Medicine

## 2011-05-13 NOTE — Telephone Encounter (Signed)
Patient calling to see if we have CT results from his CT yesterday. Please, advise

## 2011-05-13 NOTE — Telephone Encounter (Signed)
Please see separate result note to [facility]

## 2011-05-14 ENCOUNTER — Telehealth: Payer: Self-pay | Admitting: *Deleted

## 2011-05-14 NOTE — Telephone Encounter (Signed)
Spoke with patient and gave him the results. 

## 2011-05-14 NOTE — Telephone Encounter (Signed)
Message copied by Daphine Deutscher on Wed May 14, 2011  8:30 AM ------      Message from: Dennis Oliver      Created: Tue May 13, 2011  8:10 PM       Please call pt with stable pulmonary nodule over last 6 months, guidelines suggest repeat CT in 12 months. Nodule likely benign

## 2011-06-17 ENCOUNTER — Other Ambulatory Visit: Payer: Self-pay | Admitting: Internal Medicine

## 2011-06-17 NOTE — Telephone Encounter (Signed)
I am OK with refilling his Analpram cream

## 2011-06-17 NOTE — Telephone Encounter (Signed)
Patient requests refills on analpram cream. He has not had any in several years. He complains of occasional rectal bleeding, no abdominal pain, rectal pain, diarrhea or constipation. Patient had colonoscopy recently with findings of mild diverticulosis and 1 adenomatous colon polyp. Are you okay with me going ahead and giving script for analpram again?

## 2011-06-18 MED ORDER — HYDROCORTISONE ACE-PRAMOXINE 2.5-1 % RE CREA: TOPICAL_CREAM | Freq: Two times a day (BID) | RECTAL | Status: AC | PRN

## 2012-04-28 ENCOUNTER — Telehealth: Payer: Self-pay | Admitting: *Deleted

## 2012-04-28 DIAGNOSIS — R911 Solitary pulmonary nodule: Secondary | ICD-10-CM

## 2012-04-28 NOTE — Telephone Encounter (Signed)
Please schedule a follow up CT scan of the chest: Follow up 7 mm RLL lung nodule ,compare with 04/30/2012. Thanx ----- Message ----- From: Daphine Deutscher, RN Sent: 04/27/2012 1:51 PM To: Hart Carwin, MD Scheduled CT chest at [facility] radiology(Alisha) on 04/30/12 at 3:45/4:00 PM. No prep.

## 2012-04-28 NOTE — Telephone Encounter (Signed)
Patient r/s CT to 05/04/12. He also would like the report to go to Dr. Duaine Dredge at [facility].

## 2012-04-28 NOTE — Telephone Encounter (Signed)
Left a message for patient to call me. 

## 2012-04-30 ENCOUNTER — Ambulatory Visit (HOSPITAL_COMMUNITY): Payer: BC Managed Care – PPO

## 2012-05-04 ENCOUNTER — Ambulatory Visit (HOSPITAL_COMMUNITY)
Admission: RE | Admit: 2012-05-04 | Discharge: 2012-05-04 | Disposition: A | Discharge status: Home / Self Care | Payer: BC Managed Care – PPO | Source: Ambulatory Visit | Attending: Internal Medicine | Admitting: Internal Medicine

## 2012-05-04 ENCOUNTER — Telehealth: Payer: Self-pay | Admitting: Internal Medicine

## 2012-05-04 DIAGNOSIS — R911 Solitary pulmonary nodule: Secondary | ICD-10-CM | POA: Insufficient documentation

## 2012-05-04 MED ORDER — IOHEXOL 300 MG/ML  SOLN
80.0000 mL | Freq: Once | INTRAMUSCULAR | Status: AC | PRN
  Administered 2012-05-04: 80 mL via INTRAVENOUS

## 2012-05-04 NOTE — Telephone Encounter (Signed)
Patient is asking for CT results. Please, advise 

## 2012-05-05 NOTE — Telephone Encounter (Signed)
Patient given results

## 2012-11-08 ENCOUNTER — Telehealth: Payer: Self-pay | Admitting: Internal Medicine

## 2012-11-08 NOTE — Telephone Encounter (Signed)
Left a message for patient to call me. 

## 2012-11-08 NOTE — Telephone Encounter (Signed)
Spoke with patient and he has had rectal bleeding for the last week. He reports bright, red blood in toilet. Denies constipation. He reports he had a problem like this years ago and Dr. Juanda Chance gave him cortisone cream and it got better. He is asking for rx for cream. Please, advise.

## 2012-11-09 MED ORDER — HYDROCORTISONE ACE-PRAMOXINE 2.5-1 % RE CREA: TOPICAL_CREAM | RECTAL | Status: DC

## 2012-11-09 NOTE — Addendum Note (Signed)
Addended by: Daphine Deutscher on: 11/09/2012 08:43 AM   Modules accepted: Orders

## 2012-11-09 NOTE — Telephone Encounter (Signed)
Spoke with patient and rx was sent.

## 2012-11-09 NOTE — Telephone Encounter (Signed)
Analpram 2.5% ,30gm, apply tid prn rectal bleeding. ----- Message ----- From: Daphine Deutscher, RN Sent: 11/08/2012 12:48 PM To: Hart Carwin, MD

## 2013-01-05 IMAGING — CT CT CHEST W/ CM
2 of 4 series · 15 of 36 positions shown, 18 images · IV contrast (OMNIPAQUE)
Comparison: 05/12/2011 and 10/15/2010

CLINICAL DATA: Follow up indeterminate right lung nodule.

CT CHEST WITH CONTRAST
TECHNIQUE: Multidetector CT imaging of the chest was performed
following the standard protocol during bolus administration of
intravenous contrast.
Contrast: 80mL OMNIPAQUE IOHEXOL 300 MG/ML  SOLN

[Series 2: chest with st · axial · 0.79mm/px · z∈[-338,-68]mm · 12 of 64 slices shown, 15 images]
[im 5/64  mediastinal]
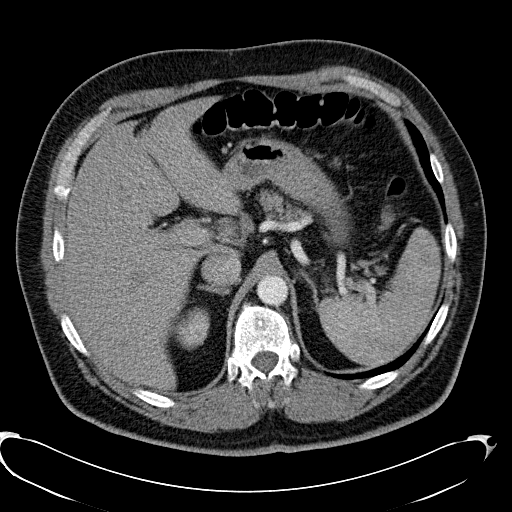
[im 5/64  lung]
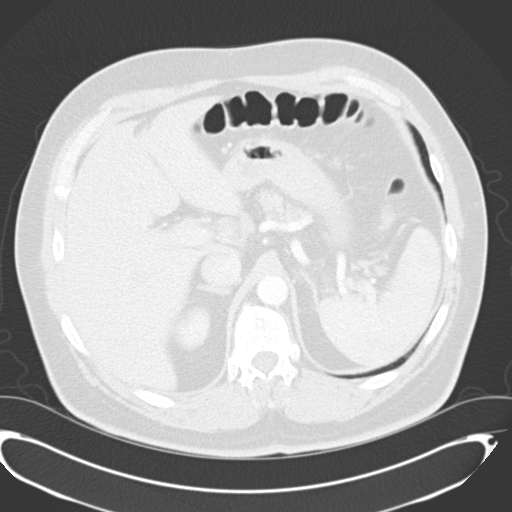
[im 10/64  lung]
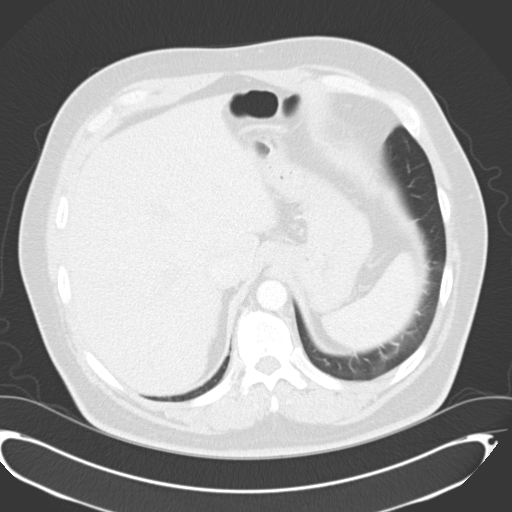
[im 15/64  lung]
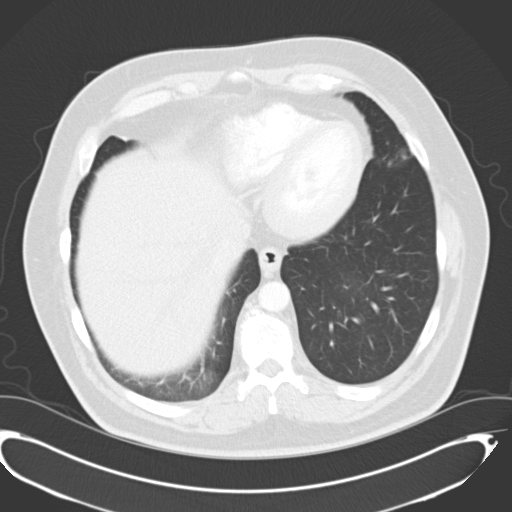
[im 20/64  lung]
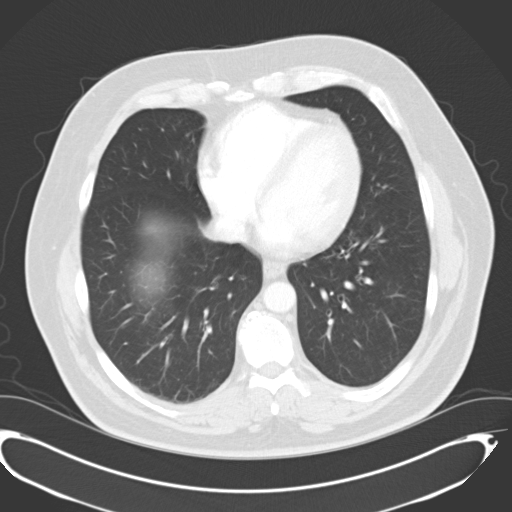
[im 25/64  mediastinal]
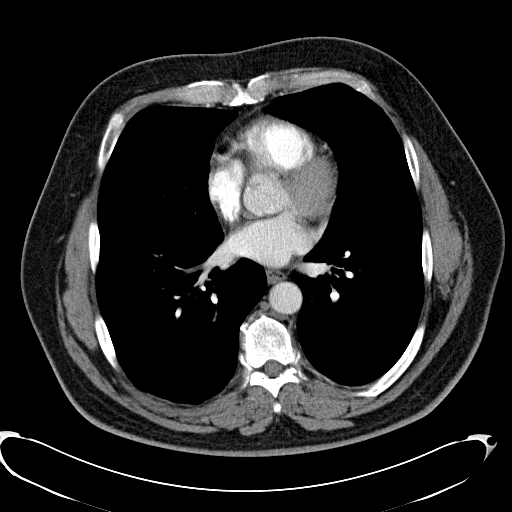
[im 25/64  lung]
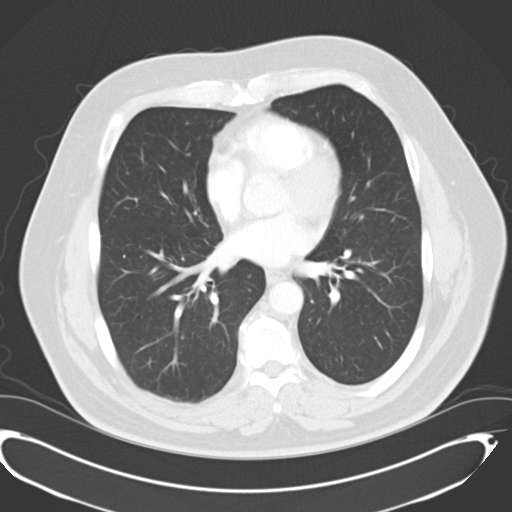
[im 30/64  lung]
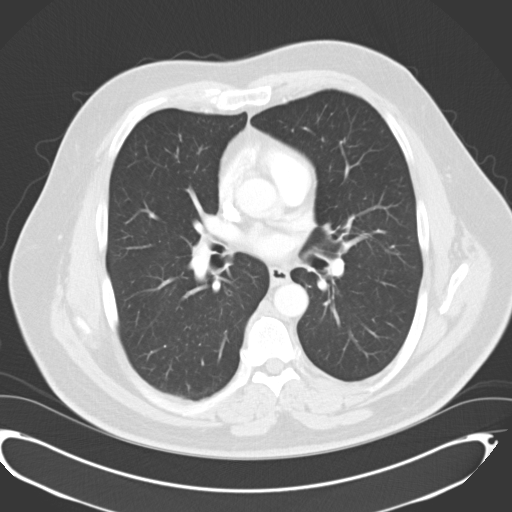
[im 34/64  lung]
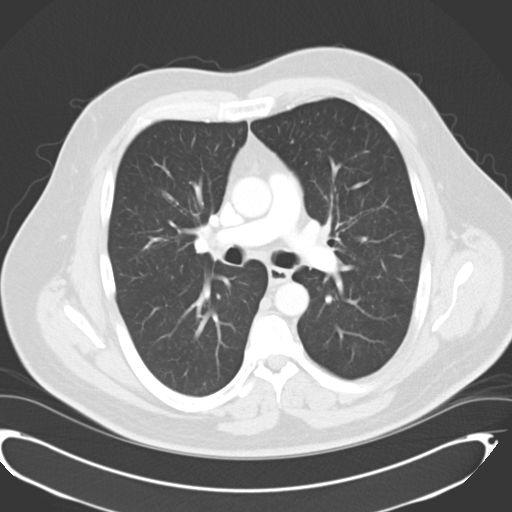
[im 39/64  lung]
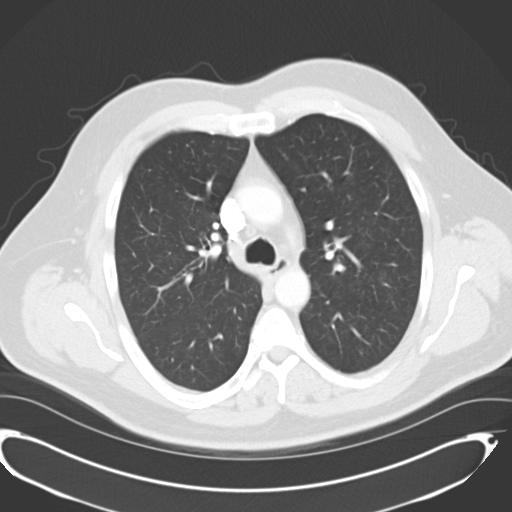
[im 44/64  mediastinal]
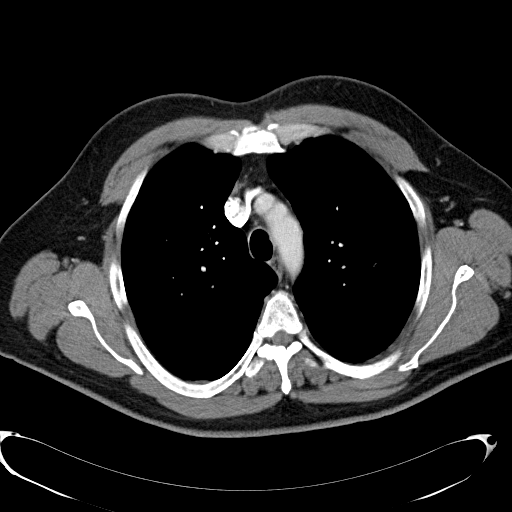
[im 44/64  lung]
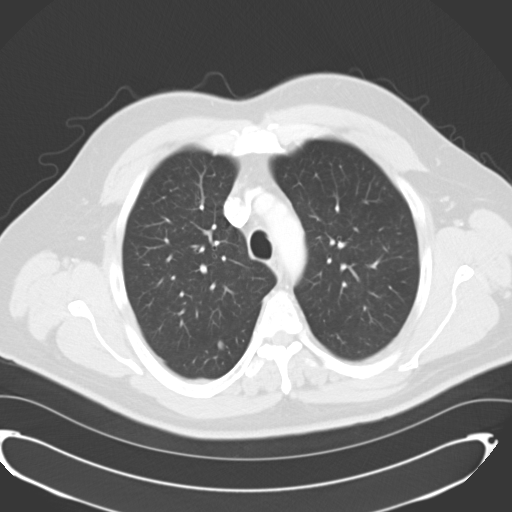
[im 49/64  lung]
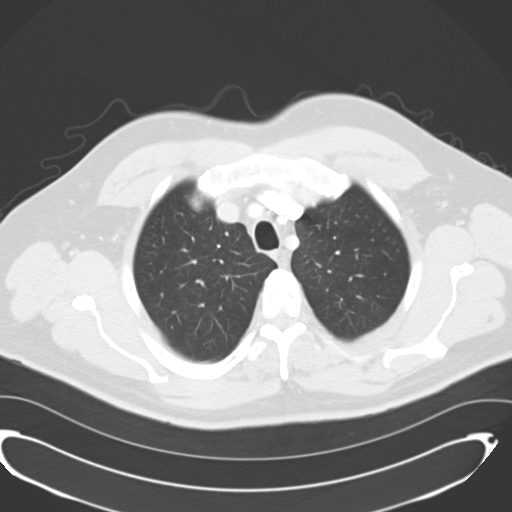
[im 54/64  lung]
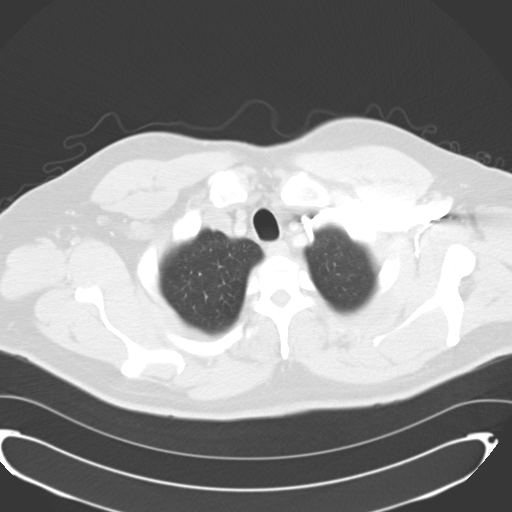
[im 59/64  lung]
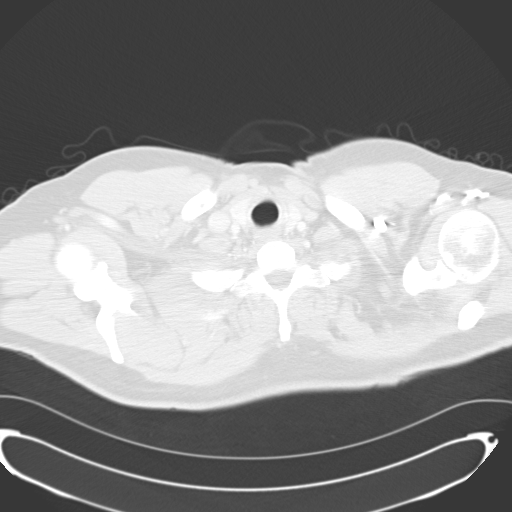

[Series 602: <mpr thick range> · coronal · 0.79mm/px · 3 of 102 slices shown]
[im 21/102  lung]
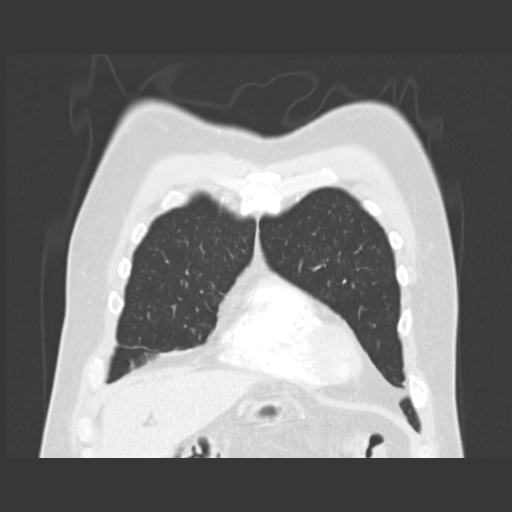
[im 41/102  lung]
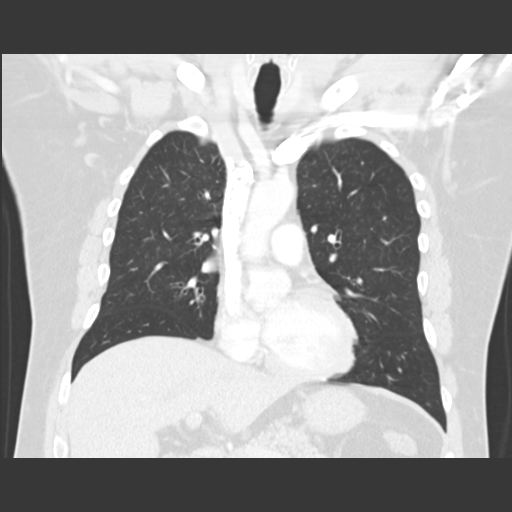
[im 61/102  lung]
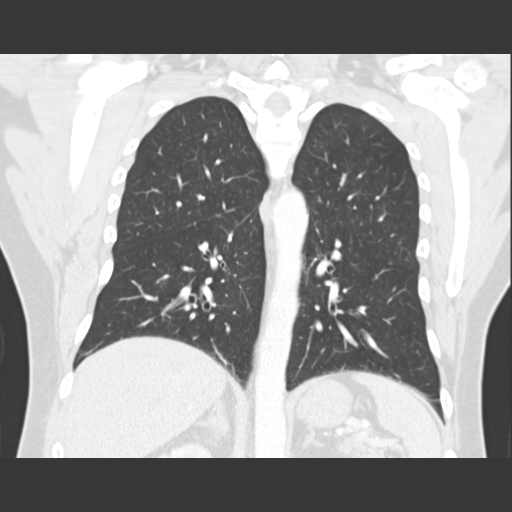

[15 of 36 positions shown; findings below may reference images not displayed]

FINDINGS: 7 mm noncalcified nodule in the superior segment right
lower lobe remains stable compared to previous exams.  No new or
enlarging pulmonary nodules or masses are identified.  No evidence
of acute infiltrate or central endobronchial lesion.

There is no evidence of pleural or pericardial effusion.  No
evidence of mediastinal or hilar masses.  No adenopathy seen
elsewhere within the thorax.
IMPRESSION: Stable 7 mm right lower lobe pulmonary nodule, which is almost
certainly benign.  In the absence of risk factors for bronchogenic
carcinoma, no additional follow-up is required.  If the patient
does have risk factors for lung carcinoma, one additional follow-up
chest CT is recommended in 6-12 months.  This recommendation
follows the consensus statement: Guidelines for Management of Small
Pulmonary Nodules Detected on CT Scans:  A Statement from the

## 2013-09-21 ENCOUNTER — Ambulatory Visit: Payer: BC Managed Care – PPO | Admitting: Nurse Practitioner

## 2013-09-26 ENCOUNTER — Encounter: Payer: Self-pay | Admitting: *Deleted

## 2013-11-22 ENCOUNTER — Ambulatory Visit: Payer: BC Managed Care – PPO | Admitting: Internal Medicine

## 2013-12-20 ENCOUNTER — Encounter: Payer: Self-pay | Admitting: Internal Medicine

## 2013-12-20 ENCOUNTER — Ambulatory Visit (INDEPENDENT_AMBULATORY_CARE_PROVIDER_SITE_OTHER): Payer: BC Managed Care – PPO | Admitting: Internal Medicine

## 2013-12-20 VITALS — BP 104/70 | HR 64 | Ht 71.0 in | Wt 246.0 lb

## 2013-12-20 DIAGNOSIS — K648 Other hemorrhoids: Secondary | ICD-10-CM

## 2013-12-20 DIAGNOSIS — Z800 Family history of malignant neoplasm of digestive organs: Secondary | ICD-10-CM

## 2013-12-20 DIAGNOSIS — K625 Hemorrhage of anus and rectum: Secondary | ICD-10-CM

## 2013-12-20 MED ORDER — HYDROCORTISONE ACE-PRAMOXINE 2.5-1 % RE CREA: TOPICAL_CREAM | Freq: Two times a day (BID) | RECTAL | Status: DC | PRN

## 2013-12-20 MED ORDER — HYDROCORTISONE ACETATE 25 MG RE SUPP: 25.0000 mg | Freq: Every day | RECTAL | Status: DC

## 2013-12-20 NOTE — Patient Instructions (Signed)
We have sent the following medications to your pharmacy for you to pick up at your convenience: [facility] will be due for a recall colonoscopy in 04/2016. We will send you a reminder in the mail when it gets closer to that time.  Please follow up with Dr Olevia Perches in September 2015.

## 2013-12-20 NOTE — Progress Notes (Signed)
Dennis Oliver [birth year] [id number]  Note: This dictation was prepared with Dragon digital system. Any transcriptional errors that result from this procedure are unintentional.   History of Present Illness:  This is a 53 year old, white male with symptomatic first-degree internal and external hemorrhoids. He has had numerous colonoscopies because his mother had colon cancer. Prior colonoscopies were in 2001, 2003, 2006 where there were tubular adenomas. His last colonoscopy was in October 2012. He describes rectal bleeding, prolapse and discomfort in the rectum several times a year. It usually responds to topical steroids. We have discussed hemorrhoidal banding in the past but he elected a conservative route. He just had a flare up last week but is feeling much better today. He saw a small amount of blood this morning. Patient denies abdominal pain or weight loss.    Past Medical History  Diagnosis Date  . H/O: gout   . Allergy   . Anemia   . Asthma   . Internal hemorrhoids   . Diverticulosis   . Tubular adenoma     Past Surgical History  Procedure Laterality Date  . Knee arthroscopy  FEB THEN 10/01/2010    right knee; HAD INFECTION REPEAT ARTHROSCOPY  . Knee arthroscopy w/ acl reconstruction  20 YEARS AGO  . Kidney stone surgery    . Colonoscopy    . Polypectomy      No Known Allergies  Family history and social history have been reviewed.  Review of Systems: Negative for weight loss abdominal pain  The remainder of the 10 point ROS is negative except as outlined in the H&P  Physical Exam: General Appearance Well developed, in no distress mildly overweight Eyes  Non icteric  HEENT  Non traumatic, normocephalic  Mouth No lesion, tongue papillated, no cheilosis Neck Supple without adenopathy, thyroid not enlarged, no carotid bruits, no JVD Lungs Clear to auscultation bilaterally COR Normal S1, normal S2, regular rhythm, no murmur, quiet precordium Abdomen soft  nontender normoactive bowel sounds Rectal erythematous, tender external hemorrhoid which prolapses through the anal canal. Normal rectal sphincter tone. At least 3 large internal hemorrhoids erythematous and congested. No thrombosis. Stool is Hemoccult negative Extremities  No pedal edema Skin No lesions Neurological Alert and oriented x 3 Psychological Normal mood and affect  Assessment and Plan:   Problem #1 Symptomatic first-grade internal and external hemorrhoids. He complains of flareups for many years. We have again discussed hemorrhoidal banding. He would be interested in doing that around the Fall of this year. For now, he will start Anusol-HC suppositories, one at bedtime and Analpram cream 2.5% twice a day. He will also follow a high fiber diet. I have asked that he do Sitz baths as well. I will see him again in September 2015 and we will reconsider a surgical referral for banding.  Problem #2 Positive family of colon cancer in patient's mother. A recall colonoscopy will be due in October 2017.   Lafayette Dragon 12/20/2013

## 2014-07-28 HISTORY — PX: HEMORRHOID BANDING: SHX5850

## 2014-07-28 HISTORY — PX: VASECTOMY: SHX75

## 2014-09-25 ENCOUNTER — Telehealth: Payer: Self-pay | Admitting: Internal Medicine

## 2014-09-25 MED ORDER — HYDROCORTISONE ACETATE 25 MG RE SUPP: 25.0000 mg | Freq: Every day | RECTAL | Status: DC

## 2014-09-25 MED ORDER — HYDROCORTISONE ACE-PRAMOXINE 2.5-1 % RE CREA: TOPICAL_CREAM | Freq: Two times a day (BID) | RECTAL | Status: DC | PRN

## 2014-09-25 NOTE — Telephone Encounter (Signed)
OK to refill Analpram 2.5% ,15gm and Anusol HC supp, #12, 1 hs, 2 rt efills for both.

## 2014-09-25 NOTE — Telephone Encounter (Signed)
Patient states he is having problems with his hemorrhoids again.Last OV was 10/2013 and he was to f/u in September which he did not do. States he has had rectal bleeding without pain for several days. Offered Ov with extender but he prefers to see Dr. Olevia Perches. He is scheduled on 11/10/14 for OV. He is asking for rx until his OV. Please, advise.

## 2014-09-25 NOTE — Telephone Encounter (Signed)
Rx's sent to pharmacy.  

## 2014-10-01 ENCOUNTER — Encounter (HOSPITAL_COMMUNITY): Payer: Self-pay | Admitting: *Deleted

## 2014-10-01 ENCOUNTER — Emergency Department (HOSPITAL_COMMUNITY): Payer: BLUE CROSS/BLUE SHIELD

## 2014-10-01 ENCOUNTER — Emergency Department (HOSPITAL_COMMUNITY)
Admission: EM | Admit: 2014-10-01 | Discharge: 2014-10-01 | Disposition: A | Discharge status: Home / Self Care | Payer: BLUE CROSS/BLUE SHIELD | Attending: Emergency Medicine | Admitting: Emergency Medicine

## 2014-10-01 DIAGNOSIS — J45909 Unspecified asthma, uncomplicated: Secondary | ICD-10-CM | POA: Diagnosis not present

## 2014-10-01 DIAGNOSIS — Z8719 Personal history of other diseases of the digestive system: Secondary | ICD-10-CM | POA: Diagnosis not present

## 2014-10-01 DIAGNOSIS — Z79899 Other long term (current) drug therapy: Secondary | ICD-10-CM | POA: Insufficient documentation

## 2014-10-01 DIAGNOSIS — Z862 Personal history of diseases of the blood and blood-forming organs and certain disorders involving the immune mechanism: Secondary | ICD-10-CM | POA: Diagnosis not present

## 2014-10-01 DIAGNOSIS — N200 Calculus of kidney: Secondary | ICD-10-CM | POA: Insufficient documentation

## 2014-10-01 DIAGNOSIS — R109 Unspecified abdominal pain: Secondary | ICD-10-CM | POA: Diagnosis present

## 2014-10-01 DIAGNOSIS — Z86018 Personal history of other benign neoplasm: Secondary | ICD-10-CM | POA: Insufficient documentation

## 2014-10-01 LAB — CBC WITH DIFFERENTIAL/PLATELET
BASOS ABS: 0 10*3/uL (ref 0.0–0.1)
BASOS PCT: 0 % (ref 0–1)
EOS PCT: 0 % (ref 0–5)
Eosinophils Absolute: 0.1 10*3/uL (ref 0.0–0.7)
HCT: 45.7 % (ref 39.0–52.0)
Hemoglobin: 15.8 g/dL (ref 13.0–17.0)
Lymphocytes Relative: 12 % (ref 12–46)
Lymphs Abs: 1.9 10*3/uL (ref 0.7–4.0)
MCH: 29.9 pg (ref 26.0–34.0)
MCHC: 34.6 g/dL (ref 30.0–36.0)
MCV: 86.4 fL (ref 78.0–100.0)
MONO ABS: 1.6 10*3/uL — AB (ref 0.1–1.0)
Monocytes Relative: 10 % (ref 3–12)
Neutro Abs: 12.4 10*3/uL — ABNORMAL HIGH (ref 1.7–7.7)
Neutrophils Relative %: 78 % — ABNORMAL HIGH (ref 43–77)
PLATELETS: 270 10*3/uL (ref 150–400)
RBC: 5.29 MIL/uL (ref 4.22–5.81)
RDW: 13.6 % (ref 11.5–15.5)
WBC: 16 10*3/uL — AB (ref 4.0–10.5)

## 2014-10-01 LAB — COMPREHENSIVE METABOLIC PANEL
ALK PHOS: 76 U/L (ref 39–117)
ALT: 21 U/L (ref 0–53)
ANION GAP: 7 (ref 5–15)
AST: 21 U/L (ref 0–37)
Albumin: 4 g/dL (ref 3.5–5.2)
BILIRUBIN TOTAL: 0.8 mg/dL (ref 0.3–1.2)
BUN: 11 mg/dL (ref 6–23)
CHLORIDE: 102 mmol/L (ref 96–112)
CO2: 29 mmol/L (ref 19–32)
Calcium: 9.1 mg/dL (ref 8.4–10.5)
Creatinine, Ser: 1.7 mg/dL — ABNORMAL HIGH (ref 0.50–1.35)
GFR calc Af Amer: 51 mL/min — ABNORMAL LOW (ref >90)
GFR calc non Af Amer: 44 mL/min — ABNORMAL LOW (ref >90)
Glucose, Bld: 105 mg/dL — ABNORMAL HIGH (ref 70–99)
Potassium: 3.9 mmol/L (ref 3.5–5.1)
SODIUM: 138 mmol/L (ref 135–145)
Total Protein: 7.1 g/dL (ref 6.0–8.3)

## 2014-10-01 LAB — URINALYSIS, ROUTINE W REFLEX MICROSCOPIC
BILIRUBIN URINE: NEGATIVE
Glucose, UA: NEGATIVE mg/dL
Ketones, ur: 40 mg/dL — AB
LEUKOCYTES UA: NEGATIVE
NITRITE: NEGATIVE
PROTEIN: NEGATIVE mg/dL
SPECIFIC GRAVITY, URINE: 1.024 (ref 1.005–1.030)
UROBILINOGEN UA: 0.2 mg/dL (ref 0.0–1.0)
pH: 5.5 (ref 5.0–8.0)

## 2014-10-01 LAB — URINE MICROSCOPIC-ADD ON

## 2014-10-01 MED ORDER — OXYCODONE-ACETAMINOPHEN 5-325 MG PO TABS: 2.0000 | ORAL_TABLET | ORAL | Status: DC | PRN

## 2014-10-01 MED ORDER — MORPHINE SULFATE 4 MG/ML IJ SOLN
4.0000 mg | Freq: Once | INTRAMUSCULAR | Status: AC
  Administered 2014-10-01: 4 mg via INTRAVENOUS
  Filled 2014-10-01: qty 1

## 2014-10-01 MED ORDER — SODIUM CHLORIDE 0.9 % IV BOLUS (SEPSIS)
1000.0000 mL | Freq: Once | INTRAVENOUS | Status: AC
  Administered 2014-10-01: 1000 mL via INTRAVENOUS

## 2014-10-01 MED ORDER — KETOROLAC TROMETHAMINE 30 MG/ML IJ SOLN
30.0000 mg | Freq: Once | INTRAMUSCULAR | Status: AC
  Administered 2014-10-01: 30 mg via INTRAVENOUS
  Filled 2014-10-01: qty 1

## 2014-10-01 MED ORDER — ONDANSETRON HCL 4 MG/2ML IJ SOLN
4.0000 mg | Freq: Once | INTRAMUSCULAR | Status: AC
  Administered 2014-10-01: 4 mg via INTRAVENOUS
  Filled 2014-10-01: qty 2

## 2014-10-01 MED ORDER — ONDANSETRON 4 MG PO TBDP: 4.0000 mg | ORAL_TABLET | Freq: Three times a day (TID) | ORAL | Status: DC | PRN

## 2014-10-01 NOTE — ED Provider Notes (Signed)
CSN: [id number]     Arrival date & time 10/01/14  1212 History   First MD Initiated Contact with Patient 10/01/14 1326     Chief Complaint  Patient presents with  . Flank Pain     (Consider location/radiation/quality/duration/timing/severity/associated sxs/prior Treatment) HPI Comments: Patient is a 54 year old male with a past medical history of asthma and kidney stone who presents with flank pain for the past 2 days. The pain is located in the right flank and does not radiate. The pain is described as aching and severe. The pain started gradually and progressively worsened since the onset. No alleviating/aggravating factors. The patient has tried OTC medication for symptoms without relief. Associated symptoms include nausea. Patient denies fever, headache, vomiting, diarrhea, chest pain, SOB, dysuria, constipation, abnormal vaginal bleeding/discharge.      Past Medical History  Diagnosis Date  . H/O: gout   . Allergy   . Anemia   . Asthma   . Internal hemorrhoids   . Diverticulosis   . Tubular adenoma    Past Surgical History  Procedure Laterality Date  . Knee arthroscopy  FEB THEN 10/01/2010    right knee; HAD INFECTION REPEAT ARTHROSCOPY  . Knee arthroscopy w/ acl reconstruction  20 YEARS AGO  . Kidney stone surgery    . Colonoscopy    . Polypectomy     Family History  Problem Relation Age of Onset  . Colon cancer Mother 1   History  Substance Use Topics  . Smoking status: Never Smoker   . Smokeless tobacco: Never Used  . Alcohol Use: 0.6 oz/week    1 Glasses of wine per week    Review of Systems  Constitutional: Negative for fever, chills and fatigue.  HENT: Negative for trouble swallowing.   Eyes: Negative for visual disturbance.  Respiratory: Negative for shortness of breath.   Cardiovascular: Negative for chest pain and palpitations.  Gastrointestinal: Negative for nausea, vomiting, abdominal pain and diarrhea.  Genitourinary: Positive for flank pain.  Negative for dysuria and difficulty urinating.  Musculoskeletal: Negative for arthralgias and neck pain.  Skin: Negative for color change.  Neurological: Negative for dizziness and weakness.  Psychiatric/Behavioral: Negative for dysphoric mood.      Allergies  Review of patient's allergies indicates no known allergies.  Home Medications   Prior to Admission medications   Medication Sig Start Date End Date Taking? Authorizing Provider  AXIRON 30 MG/ACT SOLN Apply 30 mg topically daily.  09/01/14  Yes Historical Provider, MD  BREO ELLIPTA 100-25 MCG/INH AEPB Take 1 puff by mouth daily.  09/01/14  Yes Historical Provider, MD  fexofenadine (ALLEGRA) 180 MG tablet Take 180 mg by mouth daily.     Yes Historical Provider, MD  VIAGRA 100 MG tablet Take 100 mg by mouth as needed for erectile dysfunction.  09/01/14  Yes Historical Provider, MD  hydrocortisone (ANUSOL-HC) 25 MG suppository Place 1 suppository (25 mg total) rectally at bedtime. Patient not taking: Reported on 10/01/2014 09/25/14   Lafayette Dragon, MD  hydrocortisone-pramoxine [facility]) 2.5-1 % rectal cream Place rectally 2 (two) times daily as needed for hemorrhoids or itching. Patient not taking: Reported on 10/01/2014 09/25/14   Lafayette Dragon, MD   BP 153/91 mmHg  Pulse 62  Temp(Src) 98.4 F (36.9 C) (Oral)  Resp 18  SpO2 96% Physical Exam  Constitutional: He is oriented to person, place, and time. He appears well-developed and well-nourished. No distress.  HENT:  Head: Normocephalic and atraumatic.  Eyes: Conjunctivae  and EOM are normal.  Neck: Normal range of motion.  Cardiovascular: Normal rate and regular rhythm.  Exam reveals no gallop and no friction rub.   No murmur heard. Pulmonary/Chest: Effort normal and breath sounds normal. He has no wheezes. He has no rales. He exhibits no tenderness.  Abdominal: Soft. He exhibits no distension. There is no tenderness. There is no rebound.  Genitourinary:  Right CVA tenderness   Musculoskeletal: Normal range of motion.  Neurological: He is alert and oriented to person, place, and time. Coordination normal.  Speech is goal-oriented. Moves limbs without ataxia.   Skin: Skin is warm and dry.  Psychiatric: He has a normal mood and affect. His behavior is normal.  Nursing note and vitals reviewed.   ED Course  Procedures (including critical care time) Labs Review Labs Reviewed  CBC WITH DIFFERENTIAL/PLATELET - Abnormal; Notable for the following:    WBC 16.0 (*)    Neutrophils Relative % 78 (*)    Neutro Abs 12.4 (*)    Monocytes Absolute 1.6 (*)    All other components within normal limits  COMPREHENSIVE METABOLIC PANEL - Abnormal; Notable for the following:    Glucose, Bld 105 (*)    Creatinine, Ser 1.70 (*)    GFR calc non Af Amer 44 (*)    GFR calc Af Amer 51 (*)    All other components within normal limits  URINALYSIS, ROUTINE W REFLEX MICROSCOPIC - Abnormal; Notable for the following:    Hgb urine dipstick MODERATE (*)    Ketones, ur 40 (*)    All other components within normal limits  URINE MICROSCOPIC-ADD ON    Imaging Review Ct Abdomen Pelvis Wo Contrast  10/01/2014   CLINICAL DATA:  Right flank pain.  Dysuria.  Nephrolithiasis.  EXAM: CT ABDOMEN AND PELVIS WITHOUT CONTRAST  TECHNIQUE: Multidetector CT imaging of the abdomen and pelvis was performed following the standard protocol without IV contrast.  COMPARISON:  02/16/2007  FINDINGS: Lower chest:  Unremarkable.  Hepatobiliary:  No mass visualized on this unenhanced exam.  Pancreas: No mass or inflammatory process visualized on this unenhanced exam.  Spleen:  Within normal limits in size.  Adrenal Glands:  No masses identified.  Kidneys/Urinary tract: Moderate right hydronephrosis and perinephric stranding is seen. Right ureteral dilatation is demonstrated. A 4 mm calculus is seen in the distal right ureter near the ureterovesical junction.  Stomach/Bowel/Peritoneum: Unremarkable. No normal appendix  is visualized.  Vascular/Lymphatic: No pathologically enlarged lymph nodes identified. No other significant abnormality identified.  Reproductive:  No mass or other significant abnormality noted.  Other:  None.  Musculoskeletal:  No suspicious bone lesions identified.  IMPRESSION: 4 mm distal right ureteral calculus causing moderate right hydroureteronephrosis.   Electronically Signed   By: Earle Gell M.D.   On: 10/01/2014 15:27     EKG Interpretation None      MDM   Final diagnoses:  Kidney stone on right side    3:37 PM Patient has elevated WBC at 16 and urinalysis shows moderate hemoglobin. Patient's creatinine slightly elevated to 1.7 likely due to dehydration. No UTI noted. 37mm distal right uretal stone noted on CT. Patient will be discharged with percocet and zofran for symptoms. Patient informed of results. Patient instructed to return with worsening or concerning symptoms. Patient has a urology follow up tomorrow.     Alvina Chou, PA-C 10/01/14 1544  Debby Freiberg, MD 10/04/14 2139

## 2014-10-01 NOTE — ED Notes (Signed)
Pt reports right flank pain since Friday evening, hx of kidney stones. Minimal relief with pain meds at home. Pain meds are causing constipation, denies any difficulty urinating.

## 2014-10-01 NOTE — Discharge Instructions (Signed)
Take Percocet as needed for pain. Take zofran as needed for nausea. Refer to attached documents for more information. Return to the ED with worsening or concerning symptoms. Refer to attached documents for more information.

## 2014-11-10 ENCOUNTER — Ambulatory Visit: Payer: Self-pay | Admitting: Internal Medicine

## 2014-11-16 ENCOUNTER — Encounter: Payer: Self-pay | Admitting: *Deleted

## 2014-11-17 ENCOUNTER — Encounter: Payer: Self-pay | Admitting: Internal Medicine

## 2014-11-17 ENCOUNTER — Ambulatory Visit (INDEPENDENT_AMBULATORY_CARE_PROVIDER_SITE_OTHER): Payer: BLUE CROSS/BLUE SHIELD | Admitting: Internal Medicine

## 2014-11-17 VITALS — BP 98/64 | HR 64 | Ht 71.0 in | Wt 252.0 lb

## 2014-11-17 DIAGNOSIS — K641 Second degree hemorrhoids: Secondary | ICD-10-CM | POA: Diagnosis not present

## 2014-11-17 NOTE — Progress Notes (Signed)
Dennis Oliver 17-Oct-1960 [id number]  Note: This dictation was prepared with Dragon digital system. Any transcriptional errors that result from this procedure are unintentional.   History of Present Illness: This is a  54 year old white male with chronic symptomatic hemorrhoids evaluated last time with anoscopic exam in May 2015. He was supposed to come back last fall but was quite busy and didn't make appointment until this time. His hemorrhoids bleed intermittently about once a month for several days. He denies any pain but admits to occasional prolapse were he has to reduce the hemorrhoids manually. We have done  numerous colonoscopies because of positive family history of colon cancer in his mother. In 2001, 2003, 2006 when he had tubular adenoma and last time in October 2012 no polyps found. Suggested recall colonoscopy for October 2017.On last OV  we  debated surgical hemorrhoidectomy versus band ligation, since there was a question of a prolapse which would make it 2nd grade hemorrhoids.He is mainly concerned about the pain after  Hemorrhoidal ligation.    Past Medical History  Diagnosis Date  . H/O: gout   . Allergy   . Anemia   . Asthma   . Internal hemorrhoids   . Diverticulosis   . Tubular adenoma   . Kidney stone     Past Surgical History  Procedure Laterality Date  . Knee arthroscopy  FEB THEN 10/01/2010    right knee; HAD INFECTION REPEAT ARTHROSCOPY  . Knee arthroscopy w/ acl reconstruction  20 YEARS AGO  . Kidney stone surgery    . Colonoscopy    . Polypectomy      No Known Allergies  Family history and social history have been reviewed.  Review of Systems:   The remainder of the 10 point ROS is negative except as outlined in the H&P  Physical Exam: General Appearance Well developed, in no distress, overweight Eyes  Non icteric  HEENT  Non traumatic, normocephalic  Mouth No lesion, tongue papillated, no cheilosis Neck Supple without adenopathy, thyroid  not enlarged, no carotid bruits, no JVD Lungs Clear to auscultation bilaterally COR Normal S1, normal S2, regular rhythm, no murmur, quiet precordium Abdomen  Soft nontender Rectal  And anoscopic exam reveals  small external hemorrhoids. Normal rectal sphincter tone. Enlarged first to second degree hemorrhoids which did not prolapse at the time of exam , are very edematous, blue but there is no active bleeding. Patient had no pain during the procedure Extremities  No pedal edema Skin No lesions Neurological Alert and oriented x 3 Psychological Normal mood and affect  Assessment and Plan:    54 year old white male with chronic recurrent internal hemorrhoids, first-degree. He describes prolapse but I think they're probably still first degree internal hemorrhoids. I suggested that he sees my colleague Dr Dennis Oliver for office procedure  Reducing hemorrhoids without excessive pain.  To attempt the office procedure which hopefully would be less painful than the alternative procedures.  We will set up appointment with him and in the meantime he will continue to use Anus[facility] suppositories    Dennis Oliver 11/17/2014

## 2014-11-17 NOTE — Patient Instructions (Addendum)
You have been scheduled for a hemorrhoidal banding with Dr Carlean Purl on Monday 01/08/15 @ 3:00 pm (3rd floor of [facility]).  Please continue Anusol [facility] suppositories. Dr Carlean Purl

## 2015-01-08 ENCOUNTER — Encounter: Payer: Self-pay | Admitting: Internal Medicine

## 2015-01-08 ENCOUNTER — Ambulatory Visit (INDEPENDENT_AMBULATORY_CARE_PROVIDER_SITE_OTHER): Payer: BLUE CROSS/BLUE SHIELD | Admitting: Internal Medicine

## 2015-01-08 ENCOUNTER — Encounter (INDEPENDENT_AMBULATORY_CARE_PROVIDER_SITE_OTHER): Payer: Self-pay

## 2015-01-08 VITALS — BP 112/70 | HR 60 | Ht 71.0 in | Wt 247.2 lb

## 2015-01-08 DIAGNOSIS — K648 Other hemorrhoids: Secondary | ICD-10-CM

## 2015-01-08 DIAGNOSIS — K644 Residual hemorrhoidal skin tags: Secondary | ICD-10-CM

## 2015-01-08 NOTE — Assessment & Plan Note (Addendum)
RP banded Has Gr2-3 all 3 positions RTC 2-3 weeks repeat banding

## 2015-01-08 NOTE — Patient Instructions (Addendum)
  HEMORRHOID BANDING PROCEDURE    FOLLOW-UP CARE   1. The procedure you have had should have been relatively painless since the banding of the area involved does not have nerve endings and there is no pain sensation.  The rubber band cuts off the blood supply to the hemorrhoid and the band may fall off as soon as 48 hours after the banding (the band may occasionally be seen in the toilet bowl following a bowel movement). You may notice a temporary feeling of fullness in the rectum which should respond adequately to plain Tylenol or Motrin.  2. Following the banding, avoid strenuous exercise that evening and resume full activity the next day.  A sitz bath (soaking in a warm tub) or bidet is soothing, and can be useful for cleansing the area after bowel movements.     3. To avoid constipation, take two tablespoons of natural wheat bran, natural oat bran, flax, Benefiber or any over the counter fiber supplement and increase your water intake to 7-8 glasses daily.    4. Unless you have been prescribed anorectal medication, do not put anything inside your rectum for two weeks: No suppositories, enemas, fingers, etc.  5. Occasionally, you may have more bleeding than usual after the banding procedure.  This is often from the untreated hemorrhoids rather than the treated one.  Don't be concerned if there is a tablespoon or so of blood.  If there is more blood than this, lie flat with your bottom higher than your head and apply an ice pack to the area. If the bleeding does not stop within a half an hour or if you feel faint, call our office at [phone] or go to the emergency room.  6. Problems are not common; however, if there is a substantial amount of bleeding, severe pain, chills, fever or difficulty passing urine (very rare) or other problems, you should call us at [phone] or report to the nearest emergency room.  7. Do not stay seated continuously for more than 2-3 hours for a day or  two after the procedure.  Tighten your buttock muscles 10-15 times every two hours and take 10-15 deep breaths every 1-2 hours.  Do not spend more than a few minutes on the toilet if you cannot empty your bowel; instead re-visit the toilet at a later time.    We will see you at your next appointment June 30th at 3:30PM.     I appreciate the opportunity to care for you. Silvano Rusk, M.D., [facility]

## 2015-01-10 ENCOUNTER — Emergency Department (HOSPITAL_COMMUNITY)
Admission: EM | Admit: 2015-01-10 | Discharge: 2015-01-10 | Discharge status: Absent without leave | Payer: BLUE CROSS/BLUE SHIELD | Attending: Emergency Medicine | Admitting: Emergency Medicine

## 2015-01-10 ENCOUNTER — Encounter (HOSPITAL_COMMUNITY): Payer: Self-pay | Admitting: Emergency Medicine

## 2015-01-10 NOTE — ED Notes (Signed)
Bed: WA09 Expected date: 01/10/15 Expected time: 5:25 AM Means of arrival: Ambulance Comments: Cough, rib pain, bed bugs

## 2015-01-10 NOTE — Progress Notes (Signed)
   The patient has had hemorrhoids identified at prior colonoscopy and saw Dr. Olevia Perches in May and is sent for treatment of internal hemorrhoids.  He has signs and symptoms of prolapse and manual reduction, rectal bleeding. Hydrocortisone suppositories and creams have helped. He would like definitive Tx. Does not have excessive straining, constipation or diarrhea problems.  Rectal: + anal tags, no mass and non-tender Anoscopy was performed with the patient in the left lateral decubitus position and revealed Grade 2- 3 internal hemorrhoids all positions.   PROCEDURE NOTE: The patient presents with symptomatic grade 2-3  hemorrhoids, requesting rubber band ligation of his/her hemorrhoidal disease.  All risks, benefits and alternative forms of therapy were described and informed consent was obtained.   The anorectum was pre-medicated with 0.125% NTG and 5% lidocaine The decision was made to band the RP internal hemorrhoid, and the [place] was used to perform band ligation without complication.  Digital anorectal examination was then performed to assure proper positioning of the band, and to adjust the banded tissue as required.  The patient was discharged home without pain or other issues.  Dietary and behavioral recommendations were given and along with follow-up instructions.      The patient will return in 2 weeks for  follow-up and possible additional banding as required. No complications were encountered and the patient tolerated the procedure well.   Since the banding and before note completion he was seen in ED for rib injury and Xarelto is now listed as a historical medication - we will call to clarify that as it will impact this and future banding if true.  I appreciate the opportunity to care for this patient.  Cc: [name], MD

## 2015-01-10 NOTE — ED Notes (Signed)
Pt presents for right rib pain, seen at [facility] recently for same diagnosed as rib fracture, denies SOB, productive cough x4 days with clear sputum.    VS: 160/80, 84HR, 20 Resp.

## 2015-01-12 ENCOUNTER — Encounter: Payer: Self-pay | Admitting: Internal Medicine

## 2015-01-12 ENCOUNTER — Telehealth: Payer: Self-pay

## 2015-01-12 NOTE — Telephone Encounter (Signed)
Filed a safety portal on this matter.

## 2015-01-12 NOTE — Telephone Encounter (Signed)
Spoke with  patient and he said yes he is on xarelto thou unsure exactly who rx's that.  I called the [facility] pharmacy and they don't have it on his list and they don't fill it.  I've left a message for his wife Cecille Rubin) to call me to see if we can get more information as he has an appointment with Dr. Carlean Purl on 01/25/15 for a hemorrhoid banding.  Kiven called back and said no he is not on xarelto.  I confirmed his address and it turns out the wrong phone # is listed in his chart .  This wrong # [phone] also goes to a [facility].  I have marked the xarelto off his med list and put the correct # into the system for the patient.

## 2015-01-12 NOTE — Telephone Encounter (Signed)
-----   Message from Marlon Pel, RN sent at 01/11/2015  5:08 PM EDT ----- Regarding: FW: ? on Xarelto   ----- Message -----    From: Gatha Mayer, MD    Sent: 01/10/2015   9:07 PM      To: Marlon Pel, RN Subject: ? on Xarelto                                   Completing chart from 6/13 banding and since then Xarelto added as a historical med at 6/15 ED visit  Please find out if this is so and why  Thanks

## 2015-01-25 ENCOUNTER — Encounter: Payer: Self-pay | Admitting: Internal Medicine

## 2015-01-25 ENCOUNTER — Ambulatory Visit (INDEPENDENT_AMBULATORY_CARE_PROVIDER_SITE_OTHER): Payer: BLUE CROSS/BLUE SHIELD | Admitting: Internal Medicine

## 2015-01-25 VITALS — BP 90/68 | HR 72 | Ht 71.0 in | Wt 243.4 lb

## 2015-01-25 DIAGNOSIS — K648 Other hemorrhoids: Secondary | ICD-10-CM

## 2015-01-25 NOTE — Progress Notes (Signed)
   PROCEDURE NOTE: The patient presents with symptomatic grade 2-3  hemorrhoids, requesting rubber band ligation of his/her hemorrhoidal disease.  All risks, benefits and alternative forms of therapy were described and informed consent was obtained.   The anorectum was pre-medicated with NTG and lidocaine The decision was made to band the LL and RA internal hemorrhoid, and the [place] was used to perform band ligation without complication.  Digital anorectal examination was then performed to assure proper positioning of the band, and to adjust the banded tissue as required.  The patient was discharged home without pain or other issues.  Dietary and behavioral recommendations were given and along with follow-up instructions.       The patient will return  As neededfor  follow-up and possible additional banding as required. No complications were encountered and the patient tolerated the procedure well.   I appreciate the opportunity to care for him

## 2015-01-25 NOTE — Assessment & Plan Note (Signed)
LL, RA banded F/u prn

## 2015-01-25 NOTE — Patient Instructions (Signed)
  HEMORRHOID BANDING PROCEDURE    FOLLOW-UP CARE   1. The procedure you have had should have been relatively painless since the banding of the area involved does not have nerve endings and there is no pain sensation.  The rubber band cuts off the blood supply to the hemorrhoid and the band may fall off as soon as 48 hours after the banding (the band may occasionally be seen in the toilet bowl following a bowel movement). You may notice a temporary feeling of fullness in the rectum which should respond adequately to plain Tylenol or Motrin.  2. Following the banding, avoid strenuous exercise that evening and resume full activity the next day.  A sitz bath (soaking in a warm tub) or bidet is soothing, and can be useful for cleansing the area after bowel movements.     3. To avoid constipation, take two tablespoons of natural wheat bran, natural oat bran, flax, Benefiber or any over the counter fiber supplement and increase your water intake to 7-8 glasses daily.    4. Unless you have been prescribed anorectal medication, do not put anything inside your rectum for two weeks: No suppositories, enemas, fingers, etc.  5. Occasionally, you may have more bleeding than usual after the banding procedure.  This is often from the untreated hemorrhoids rather than the treated one.  Don't be concerned if there is a tablespoon or so of blood.  If there is more blood than this, lie flat with your bottom higher than your head and apply an ice pack to the area. If the bleeding does not stop within a half an hour or if you feel faint, call our office at [phone] or go to the emergency room.  6. Problems are not common; however, if there is a substantial amount of bleeding, severe pain, chills, fever or difficulty passing urine (very rare) or other problems, you should call us at [phone] or report to the nearest emergency room.  7. Do not stay seated continuously for more than 2-3 hours for a day or  two after the procedure.  Tighten your buttock muscles 10-15 times every two hours and take 10-15 deep breaths every 1-2 hours.  Do not spend more than a few minutes on the toilet if you cannot empty your bowel; instead re-visit the toilet at a later time.    Follow up with [place] as needed.    I appreciate the opportunity to care for you. Silvano Rusk, MD, [facility]

## 2015-02-14 ENCOUNTER — Encounter: Payer: Self-pay | Admitting: Internal Medicine

## 2015-06-04 IMAGING — CT CT ABD-PELV W/O CM
2 of 4 series · 17 of 46 positions shown, 19 images · non-contrast
Comparison: 02/16/2007

CLINICAL DATA: Right flank pain.  Dysuria.  Nephrolithiasis.

EXAM:
CT ABDOMEN AND PELVIS WITHOUT CONTRAST
TECHNIQUE: Multidetector CT imaging of the abdomen and pelvis was performed
following the standard protocol without IV contrast.

[Series 2: stone study 5.0 i30f 1 · axial · 0.81mm/px · z∈[+1096,+1546]mm · 14 of 98 slices shown, 16 images]
[im 4/98  soft-tissue]
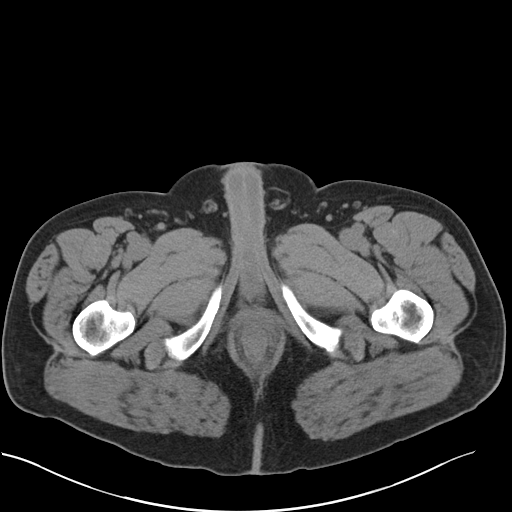
[im 4/98  bone]
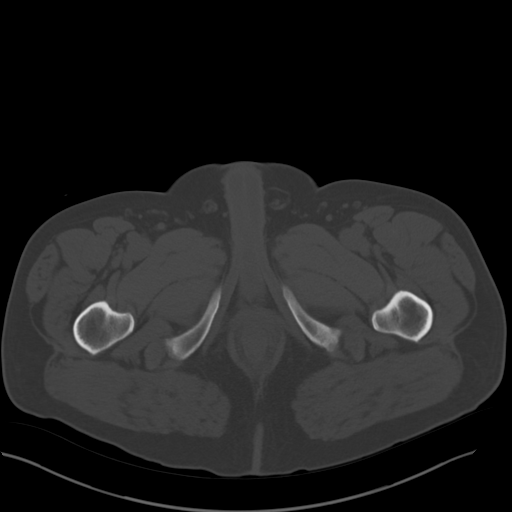
[im 12/98  soft-tissue]
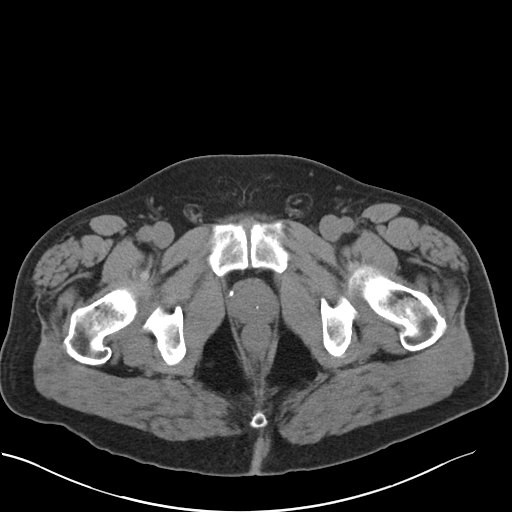
[im 20/98  soft-tissue]
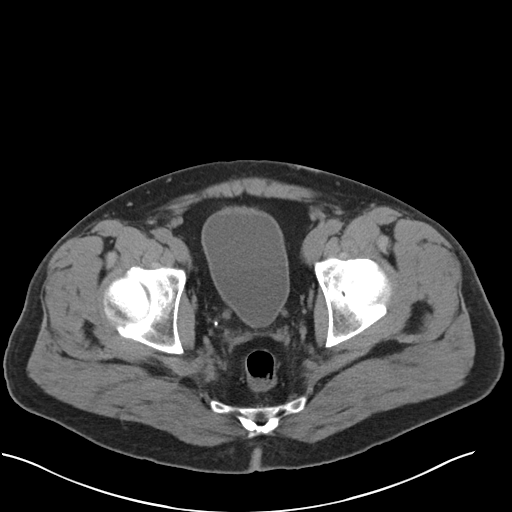
[im 28/98  soft-tissue]
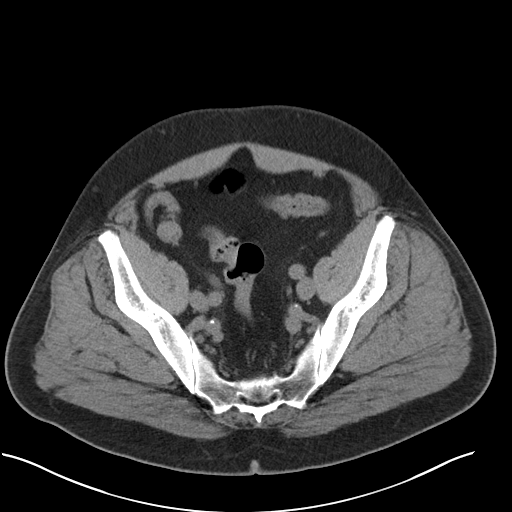
[im 32/98  soft-tissue]
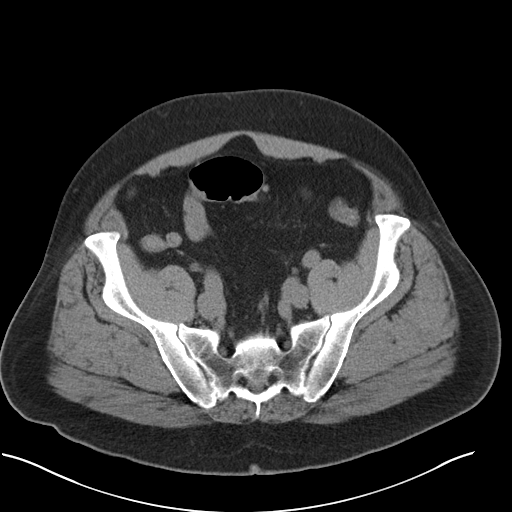
[im 39/98  soft-tissue]
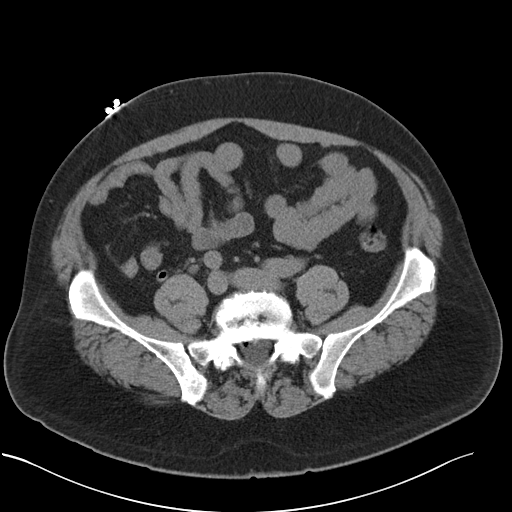
[im 47/98  soft-tissue]
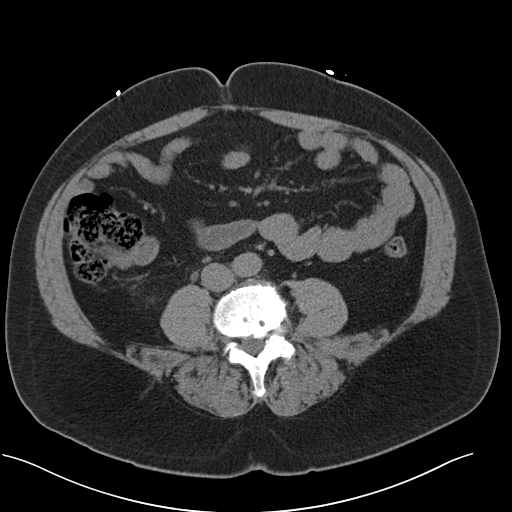
[im 51/98  soft-tissue]
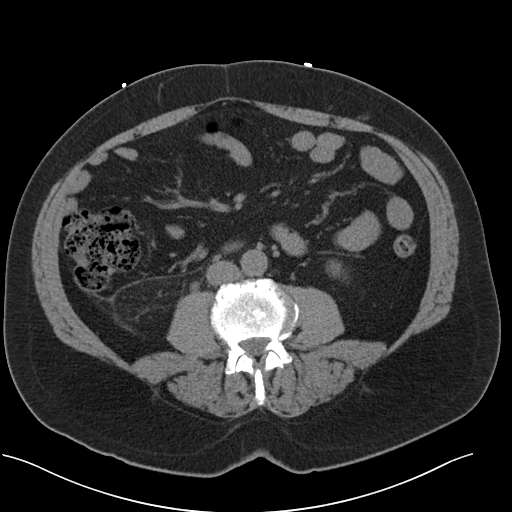
[im 59/98  soft-tissue]
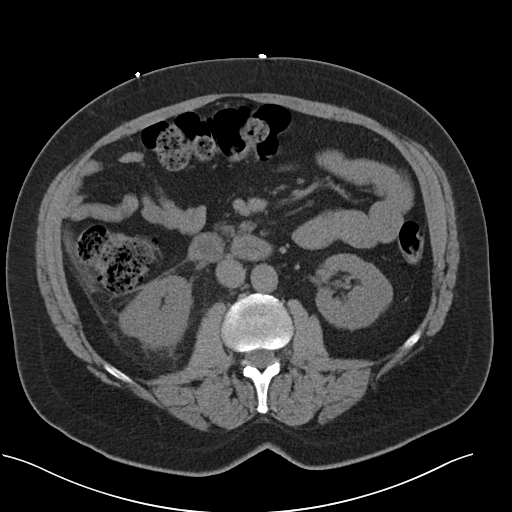
[im 59/98  bone]
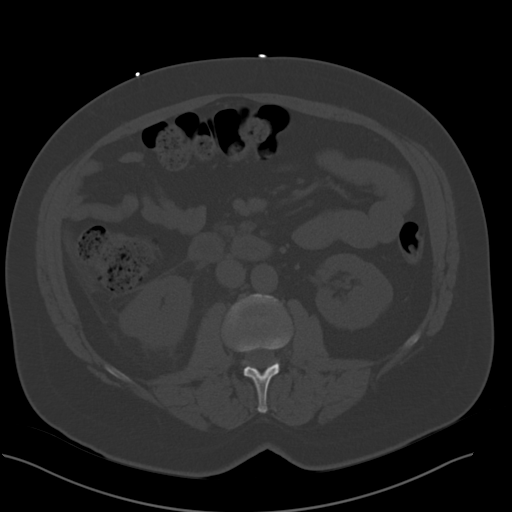
[im 66/98  soft-tissue]
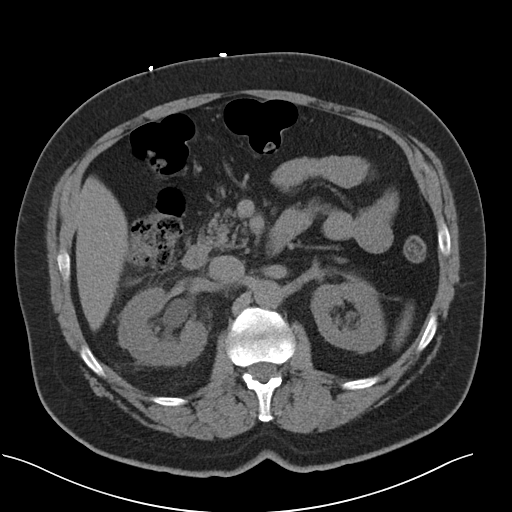
[im 74/98  soft-tissue]
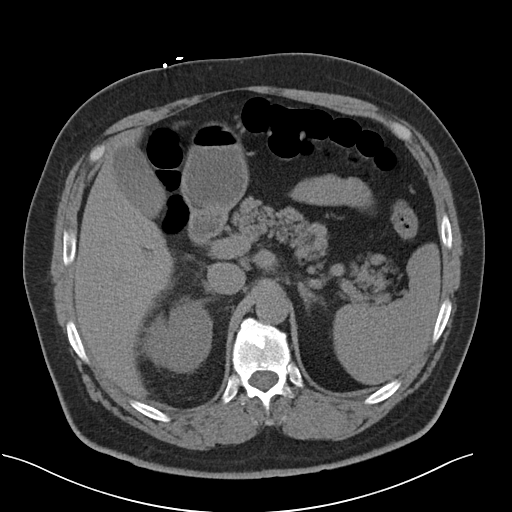
[im 78/98  soft-tissue]
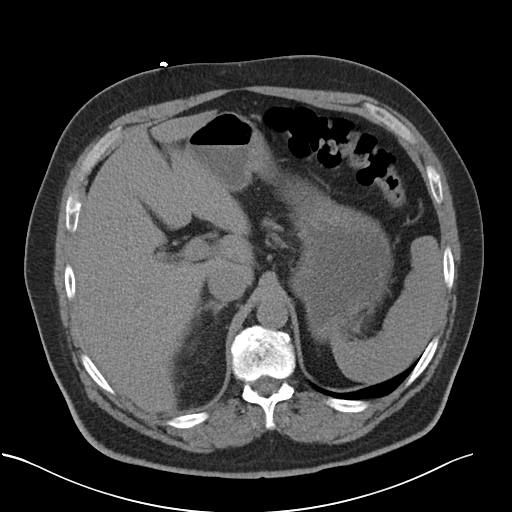
[im 86/98  soft-tissue]
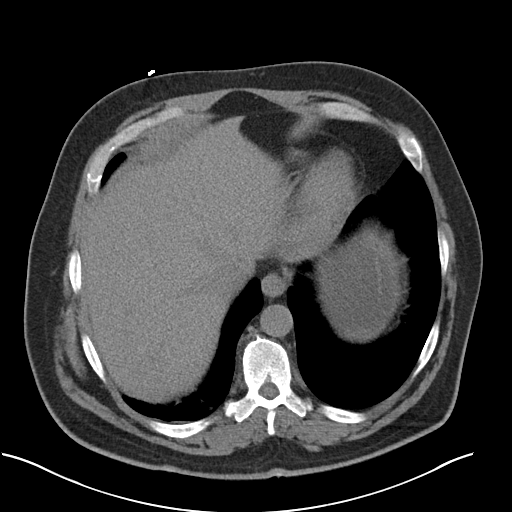
[im 94/98  soft-tissue]
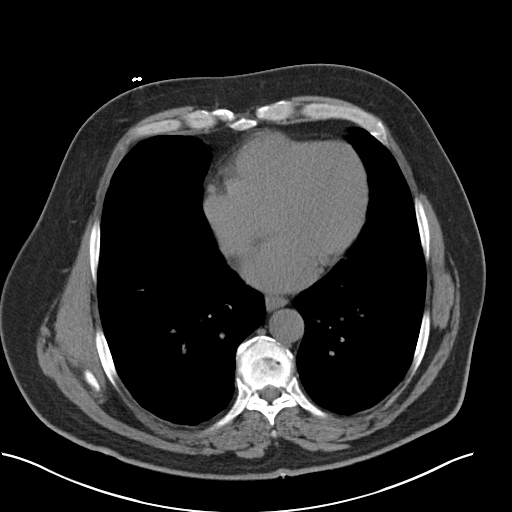

[Series 5: coronal soft tissue · coronal · 0.94mm/px · 3 of 115 slices shown]
[im 39/115  soft-tissue]
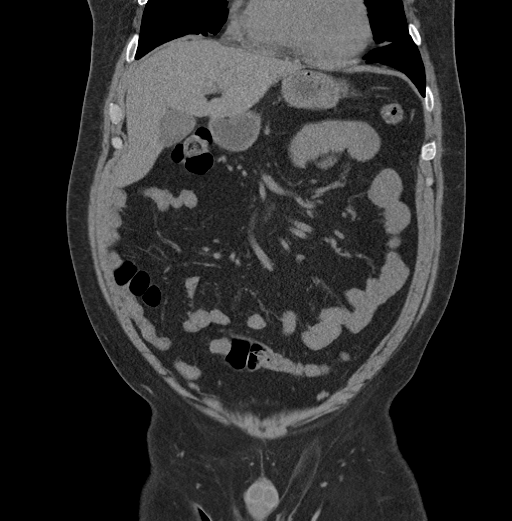
[im 51/115  soft-tissue]
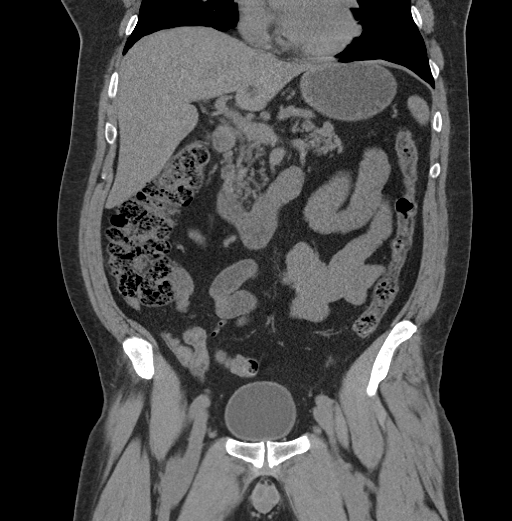
[im 64/115  soft-tissue]
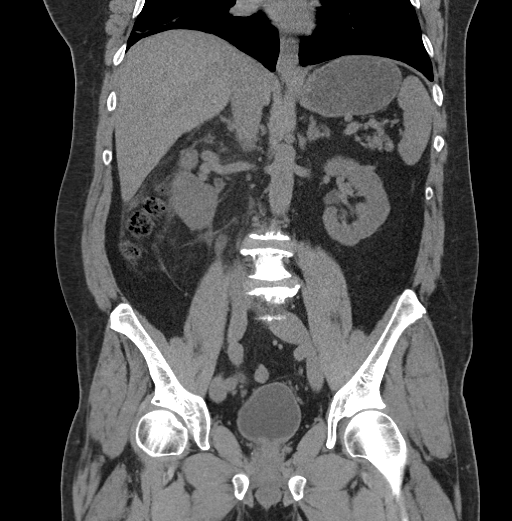

[17 of 46 positions shown; findings below may reference images not displayed]

FINDINGS: Lower chest:  Unremarkable.

Hepatobiliary:  No mass visualized on this unenhanced exam.

Pancreas: No mass or inflammatory process visualized on this
unenhanced exam.

Spleen:  Within normal limits in size.

Adrenal Glands:  No masses identified.

Kidneys/Urinary tract: Moderate right hydronephrosis and perinephric
stranding is seen. Right ureteral dilatation is demonstrated. A 4 mm
calculus is seen in the distal right ureter near the ureterovesical
junction.

Stomach/Bowel/Peritoneum: Unremarkable. No normal appendix is
visualized.

Vascular/Lymphatic: No pathologically enlarged lymph nodes
identified. No other significant abnormality identified.

Reproductive:  No mass or other significant abnormality noted.

Other:  None.

Musculoskeletal:  No suspicious bone lesions identified.
IMPRESSION: 4 mm distal right ureteral calculus causing moderate right
hydroureteronephrosis.

## 2015-12-27 ENCOUNTER — Ambulatory Visit (INDEPENDENT_AMBULATORY_CARE_PROVIDER_SITE_OTHER): Payer: BLUE CROSS/BLUE SHIELD | Admitting: Podiatry

## 2015-12-27 ENCOUNTER — Encounter: Payer: Self-pay | Admitting: Podiatry

## 2015-12-27 VITALS — BP 113/73 | HR 68 | Resp 12

## 2015-12-27 DIAGNOSIS — T148 Other injury of unspecified body region: Secondary | ICD-10-CM | POA: Diagnosis not present

## 2015-12-27 DIAGNOSIS — T148XXA Other injury of unspecified body region, initial encounter: Secondary | ICD-10-CM

## 2015-12-27 NOTE — Progress Notes (Signed)
He presents today with a chief concern of discoloration to the hallux nail left. He states that approximately 2 months ago he remember tripping and jamming his toe. He states that after that he noticed discoloration but not a lot of pain and less direct palpation. He denies any other trauma. He denies noticing any coloration prior to the trauma. He states that he has been watching the discoloration for the past 2 months and it has not grown out. He also states that he has not been cutting the nail very much.  Objective: Vital signs are stable alert and oriented 3 pulses are palpable. Neurologic sensorium is intact deep tendon reflexes are intact muscle strength is normal bilateral. Orthopedic evaluation shows mild hallux valgus deformities bilateral. He has flexure contractions of the PIPJ of the second digits bilaterally which are rigid in nature. He are not painful but he questioned these today. Cutaneous evaluation demonstrates supple well-hydrated cutis no erythema or edema cellulitis drainage or odor. Discoloration beneath the lateral aspect of the hallux nail plate left it appears that there is also more superficial layer of discoloration which appears to be blood. There appears to be loosening of the nail margin proximal lateral.  Assessment: Subungual hematoma hallux left.  Plan: I advised him to follow up with me in 3 months if this has not started growing out. I explained to him that we may need to remove the nail or evaluation.

## 2016-01-03 ENCOUNTER — Ambulatory Visit: Payer: BLUE CROSS/BLUE SHIELD | Admitting: Podiatry

## 2016-01-17 ENCOUNTER — Other Ambulatory Visit: Payer: Self-pay | Admitting: Urology

## 2016-01-17 DIAGNOSIS — D751 Secondary polycythemia: Secondary | ICD-10-CM

## 2016-02-11 ENCOUNTER — Ambulatory Visit (HOSPITAL_COMMUNITY)
Admission: RE | Admit: 2016-02-11 | Discharge: 2016-02-11 | Disposition: A | Discharge status: Home / Self Care | Payer: BLUE CROSS/BLUE SHIELD | Source: Ambulatory Visit | Attending: Urology | Admitting: Urology

## 2016-02-11 ENCOUNTER — Encounter (HOSPITAL_COMMUNITY): Payer: Self-pay

## 2016-02-11 VITALS — BP 112/76 | HR 62 | Temp 98.5°F | Resp 18 | Wt 243.2 lb

## 2016-02-11 DIAGNOSIS — D751 Secondary polycythemia: Secondary | ICD-10-CM | POA: Diagnosis not present

## 2016-02-11 NOTE — Discharge Instructions (Signed)
Therapeutic Phlebotomy, Care After Refer to this sheet in the next few weeks. These instructions provide you with information about caring for yourself after your procedure. Your health care provider may also give you more specific instructions. Your treatment has been planned according to current medical practices, but problems sometimes occur. Call your health care provider if you have any problems or questions after your procedure. WHAT TO EXPECT AFTER THE PROCEDURE After your procedure, it is common to have:  Light-headedness or dizziness. You may feel faint.  Nausea.  Tiredness. HOME CARE INSTRUCTIONS Activities  Return to your normal activities as directed by your health care provider. Most people can go back to their normal activities right away.  Avoid strenuous physical activity and heavy lifting or pulling for about 5 hours after the procedure. Do not lift anything that is heavier than 10 lb (4.5 kg).  Athletes should avoid strenuous exercise for at least 12 hours.  Change positions slowly for the remainder of the day. This will help to prevent light-headedness or fainting.  If you feel light-headed, lie down until the feeling goes away. Eating and Drinking  Be sure to eat well-balanced meals for the next 24 hours.  Drink enough fluid to keep your urine clear or pale yellow.  Avoid drinking alcohol on the day that you had the procedure. Care of the Needle Insertion Site  Keep your bandage dry. You can remove the bandage after about 5 hours or as directed by your health care provider.  If you have bleeding from the needle insertion site, elevate your arm and press firmly on the site until the bleeding stops.  If you have bruising at the site, apply ice to the area:  Put ice in a plastic bag.  Place a towel between your skin and the bag.  Leave the ice on for 20 minutes, 2-3 times a day for the first 24 hours.  If the swelling does not go away after 24 hours, apply  a warm, moist washcloth to the area for 20 minutes, 2-3 times a day. General Instructions  Avoid smoking for at least 30 minutes after the procedure.  Keep all follow-up visits as directed by your health care provider. It is important to continue with further therapeutic phlebotomy treatments as directed. SEEK MEDICAL CARE IF:  You have redness, swelling, or pain at the needle insertion site.  You have fluid, blood, or pus coming from the needle insertion site.  You feel light-headed, dizzy, or nauseated, and the feeling does not go away.  You notice new bruising at the needle insertion site.  You feel weaker than normal.  You have a fever or chills. SEEK IMMEDIATE MEDICAL CARE IF:  You have severe nausea or vomiting.  You have chest pain.  You have trouble breathing.   This information is not intended to replace advice given to you by your health care provider. Make sure you discuss any questions you have with your health care provider.   Document Released: 12/16/2010 Document Revised: 11/28/2014 Document Reviewed: 07/10/2014 Elsevier Interactive Patient Education 2016 [employer]. Therapeutic Phlebotomy Therapeutic phlebotomy is the controlled removal of blood from a person's body for the purpose of treating a medical condition. The procedure is similar to donating blood. Usually, about a pint (470 mL, or 0.47L) of blood is removed. The average adult has 9-12 pints (4.3-5.7 L) of blood. Therapeutic phlebotomy may be used to treat the following medical conditions:  Hemochromatosis. This is a condition in which the blood  contains too much iron.  Polycythemia vera. This is a condition in which the blood contains too many red blood cells.  Porphyria cutanea tarda. This is a disease in which an important part of hemoglobin is not made properly. It results in the buildup of abnormal amounts of porphyrins in the body.  Sickle cell disease. This is a condition in which the red  blood cells form an abnormal crescent shape rather than a round shape. LET [facility] CARE PROVIDER KNOW ABOUT:  Any allergies you have.  All medicines you are taking, including vitamins, herbs, eye drops, creams, and over-the-counter medicines.  Previous problems you or members of your family have had with the use of anesthetics.  Any blood disorders you have.  Previous surgeries you have had.  Any medical conditions you may have. RISKS AND COMPLICATIONS Generally, this is a safe procedure. However, problems may occur, including:  Nausea or light-headedness.  Low blood pressure.  Soreness, bleeding, swelling, or bruising at the needle insertion site.  Infection. BEFORE THE PROCEDURE  Follow instructions from your health care provider about eating or drinking restrictions.  Ask your health care provider about changing or stopping your regular medicines. This is especially important if you are taking diabetes medicines or blood thinners.  Wear clothing with sleeves that can be raised above the elbow.  Plan to have someone take you home after the procedure.  You may have a blood sample taken. PROCEDURE  A needle will be inserted into one of your veins.  Tubing and a collection bag will be attached to that needle.  Blood will flow through the needle and tubing into the collection bag.  You may be asked to open and close your hand slowly and continually during the entire collection.  After the specified amount of blood has been removed from your body, the collection bag and tubing will be clamped.  The needle will be removed from your vein.  Pressure will be held on the site of the needle insertion to stop the bleeding.  A bandage (dressing) will be placed over the needle insertion site. The procedure may vary among health care providers and hospitals. AFTER THE PROCEDURE  Your recovery will be assessed and monitored.  You can return to your normal activities as  directed by your health care provider.   This information is not intended to replace advice given to you by your health care provider. Make sure you discuss any questions you have with your health care provider.   Document Released: 12/16/2010 Document Revised: 11/28/2014 Document Reviewed: 07/10/2014 Elsevier Interactive Patient Education [employer].

## 2016-02-11 NOTE — Procedures (Signed)
Explained procedure to pt.  Started a #18 angiocath to rt ac.  553ml drawn off as ordered.  Started at 1125 and finished at 1200.  Vss, afebrile post phlebotomy, no lightheadedness, no dizziness.  Pt states I feel fine.  Had pt stay for 34min. Post phlebotomy.  Pt has no dizzness, states he feels great.  Pt d/c ambulatory to lobby.

## 2016-05-07 ENCOUNTER — Encounter: Payer: Self-pay | Admitting: Internal Medicine

## 2016-09-03 ENCOUNTER — Ambulatory Visit: Payer: BLUE CROSS/BLUE SHIELD | Admitting: Rheumatology

## 2016-09-04 DIAGNOSIS — M1A09X0 Idiopathic chronic gout, multiple sites, without tophus (tophi): Secondary | ICD-10-CM | POA: Insufficient documentation

## 2016-09-04 DIAGNOSIS — M170 Bilateral primary osteoarthritis of knee: Secondary | ICD-10-CM | POA: Insufficient documentation

## 2016-09-04 NOTE — Progress Notes (Signed)
Office Visit Note  Patient: Dennis Oliver             Date of Birth: 02-09-1961           MRN: [id number]             PCP: Michel Bickers, MD Referring: Michel Bickers, MD Visit Date: 09/08/2016 Occupation: _0 @    Subjective:  No chief complaint on file. Follow-up on gout  History of Present Illness: Dennis Oliver is a 56 y.o. male  Last seen 04/06/2015. Patient is doing well and is only returned today because he needs a refill of his allopurinol. He states that his gout is well controlled and has not had any flare since the last visit. Also note: On the last visit he did not have any flares as well and he was doing well with allopurinol then as he is now.  He has a history of osteoarthritis of the knees and has history of right knee joint problem. Dr. Durward Fortes had done the anterior cruciate ligament replacement/repair in 1990. Dr. Reynaldo Minium is giving him Visco supplementation in the right knee.  He needs a refill of his allopurinol. He has colchicine at home but it is quite old and probably expired according to the patient.  He has a history of asthma renal calcinosis and melanoma. Patient had a melanoma check recently and had a few more lesions removed in the last couple of months.   Activities of Daily Living:  Patient reports morning stiffness for 30 minutes.   Patient Denies nocturnal pain.  Difficulty dressing/grooming: Denies Difficulty climbing stairs: Denies Difficulty getting out of chair: Denies Difficulty using hands for taps, buttons, cutlery, and/or writing: Denies   Review of Systems  Constitutional: Negative for fatigue.  HENT: Negative for mouth sores and mouth dryness.   Eyes: Negative for dryness.  Respiratory: Negative for shortness of breath.   Gastrointestinal: Negative for constipation and diarrhea.  Musculoskeletal: Negative for myalgias and myalgias.  Skin: Negative for sensitivity to sunlight.  Neurological: Negative for memory  loss.  Psychiatric/Behavioral: Negative for sleep disturbance.    PMFS History:  Patient Active Problem List   Diagnosis Date Noted  . Idiopathic chronic gout of multiple sites without tophus 09/04/2016  . Primary osteoarthritis of both knees 09/04/2016  . Anal skin tag 01/08/2015  . Gout 10/29/2010  . Asthma 10/29/2010  . Allergic rhinitis 10/29/2010  . Nephrolithiasis 10/29/2010  . Hemorrhoids, internal, with bleeding 10/29/2010  . ASTHMA 03/26/2009    Past Medical History:  Diagnosis Date  . Allergy   . Anemia   . Asthma   . DISLOCATION CLOSED INTERPHALANGEAL HAND 03/26/2009   Qualifier: Diagnosis of  By: Aline Brochure MD, Dorothyann Peng    . Diverticulosis   . H/O: gout   . Internal hemorrhoids   . Kidney stone   . Septic arthritis of knee, right ([place]) 10/29/2010  . Tubular adenoma     Family History  Problem Relation Age of Onset  . Colon cancer Mother 1   Past Surgical History:  Procedure Laterality Date  . COLONOSCOPY    . HEMORRHOID BANDING  2016  . KIDNEY STONE SURGERY    . KNEE ARTHROSCOPY  FEB THEN 10/01/2010   right knee; HAD INFECTION REPEAT ARTHROSCOPY  . KNEE ARTHROSCOPY W/ ACL RECONSTRUCTION  20 YEARS AGO   Social History   Social History Narrative  . No narrative on file     Objective: Vital Signs: There were no vitals taken for this  visit.   Physical Exam  Constitutional: He is oriented to person, place, and time. He appears well-developed and well-nourished.  HENT:  Head: Normocephalic and atraumatic.  Eyes: Conjunctivae and EOM are normal. Pupils are equal, round, and reactive to light.  Neck: Normal range of motion. Neck supple.  Cardiovascular: Normal rate, regular rhythm and normal heart sounds.  Exam reveals no gallop and no friction rub.   No murmur heard. Pulmonary/Chest: Effort normal and breath sounds normal. No respiratory distress. He has no wheezes. He has no rales. He exhibits no tenderness.  Abdominal: Soft. He exhibits no distension  and no mass. There is no tenderness. There is no guarding.  Musculoskeletal: Normal range of motion.  Lymphadenopathy:    He has no cervical adenopathy.  Neurological: He is alert and oriented to person, place, and time. He exhibits normal muscle tone. Coordination normal.  Skin: Skin is warm and dry. Capillary refill takes less than 2 seconds. No rash noted.  Psychiatric: He has a normal mood and affect. His behavior is normal. Judgment and thought content normal.  Nursing note and vitals reviewed.    Musculoskeletal Exam:  Full range of motion of all joints Grip strength is equal and strong bilaterally Fiber myalgia tender points are all absent  CDAI Exam: CDAI Homunculus Exam:   Joint Counts:  CDAI Tender Joint count: 0 CDAI Swollen Joint count: 0  Global Assessments:  Patient Global Assessment: 0 Provider Global Assessment: 0  No synovitis on examination  Investigation: No additional findings.  No visits with results within 6 Month(s) from this visit.  Latest known visit with results is:  Admission on 10/01/2014, Discharged on 10/01/2014  Component Date Value Ref Range Status  . WBC 10/01/2014 16.0* 4.0 - 10.5 K/uL Final  . RBC 10/01/2014 5.29  4.22 - 5.81 MIL/uL Final  . Hemoglobin 10/01/2014 15.8  13.0 - 17.0 g/dL Final  . HCT 10/01/2014 45.7  39.0 - 52.0 % Final  . MCV 10/01/2014 86.4  78.0 - 100.0 fL Final  . MCH 10/01/2014 29.9  26.0 - 34.0 pg Final  . MCHC 10/01/2014 34.6  30.0 - 36.0 g/dL Final  . RDW 10/01/2014 13.6  11.5 - 15.5 % Final  . Platelets 10/01/2014 270  150 - 400 K/uL Final  . Neutrophils Relative % 10/01/2014 78* 43 - 77 % Final  . Neutro Abs 10/01/2014 12.4* 1.7 - 7.7 K/uL Final  . Lymphocytes Relative 10/01/2014 12  12 - 46 % Final  . Lymphs Abs 10/01/2014 1.9  0.7 - 4.0 K/uL Final  . Monocytes Relative 10/01/2014 10  3 - 12 % Final  . Monocytes Absolute 10/01/2014 1.6* 0.1 - 1.0 K/uL Final  . Eosinophils Relative 10/01/2014 0  0 - 5 %  Final  . Eosinophils Absolute 10/01/2014 0.1  0.0 - 0.7 K/uL Final  . Basophils Relative 10/01/2014 0  0 - 1 % Final  . Basophils Absolute 10/01/2014 0.0  0.0 - 0.1 K/uL Final  . Sodium 10/01/2014 138  135 - 145 mmol/L Final  . Potassium 10/01/2014 3.9  3.5 - 5.1 mmol/L Final  . Chloride 10/01/2014 102  96 - 112 mmol/L Final  . CO2 10/01/2014 29  19 - 32 mmol/L Final  . Glucose, Bld 10/01/2014 105* 70 - 99 mg/dL Final  . BUN 10/01/2014 11  6 - 23 mg/dL Final  . Creatinine, Ser 10/01/2014 1.70* 0.50 - 1.35 mg/dL Final  . Calcium 10/01/2014 9.1  8.4 - 10.5 mg/dL Final  .  Total Protein 10/01/2014 7.1  6.0 - 8.3 g/dL Final  . Albumin 74/83/2252 4.0  3.5 - 5.2 g/dL Final  . AST 51/91/8637 21  0 - 37 U/L Final  . ALT 10/01/2014 21  0 - 53 U/L Final  . Alkaline Phosphatase 10/01/2014 76  39 - 117 U/L Final  . Total Bilirubin 10/01/2014 0.8  0.3 - 1.2 mg/dL Final  . GFR calc non Af Amer 10/01/2014 44* >90 mL/min Final  . GFR calc Af Amer 10/01/2014 51* >90 mL/min Final   Comment: (NOTE) The eGFR has been calculated using the CKD EPI equation. This calculation has not been validated in all clinical situations. eGFR's persistently <90 mL/min signify possible Chronic Kidney Disease.   . Anion gap 10/01/2014 7  5 - 15 Final  . Color, Urine 10/01/2014 YELLOW  YELLOW Final  . APPearance 10/01/2014 CLEAR  CLEAR Final  . Specific Gravity, Urine 10/01/2014 1.024  1.005 - 1.030 Final  . pH 10/01/2014 5.5  5.0 - 8.0 Final  . Glucose, UA 10/01/2014 NEGATIVE  NEGATIVE mg/dL Final  . Hgb urine dipstick 10/01/2014 MODERATE* NEGATIVE Final  . Bilirubin Urine 10/01/2014 NEGATIVE  NEGATIVE Final  . Ketones, ur 10/01/2014 40* NEGATIVE mg/dL Final  . Protein, ur 80/87/9037 NEGATIVE  NEGATIVE mg/dL Final  . Urobilinogen, UA 10/01/2014 0.2  0.0 - 1.0 mg/dL Final  . Nitrite 90/84/2672 NEGATIVE  NEGATIVE Final  . Leukocytes, UA 10/01/2014 NEGATIVE  NEGATIVE Final  . RBC / HPF 10/01/2014 0-2  <3 RBC/hpf Final       Imaging: No results found.  Speciality Comments: No specialty comments available.    Procedures:  No procedures performed Allergies: Suboxone [buprenorphine hcl-naloxone hcl]; Shellfish allergy; and Vicodin [hydrocodone-acetaminophen]   Assessment / Plan:     Visit Diagnoses: Idiopathic chronic gout of multiple sites without tophus  Primary osteoarthritis of both knees  History of kidney stones  History of melanoma   Plan: #1: Gout. No flare. Doing well with allopurinol. Takes 300 mg of allopurinol, half tablet every day. Is needing a refill on this medication. Has not had any need for colchicine and patient thinks that the colchicine is probably expired.  #2: History of hand and feet pain. This is consistent with osteoarthritis.  About 56 months ago, he had pain in his left wrist joint. He saw [facility] orthopedics. They did x-ray which showed the left wrist joint was normal. Patient states that the left wrist joint still hurts at times. He is happy to follow up with them since the problem is still there but I offered her Voltaren gel in the meanwhile and patient is agreeable to try Voltaren gel. We discussed the proper way of using the medication.  #3: CBC with differential, CMP with GFR, uric acid to be done today.  #4: Sample of mitigating 0.6 mg 15 tablet was supplied to the patient as a sample  #5: Patient had a history of trapezius muscle pain and it was injected by Dr. Corliss Skains in the September 2016 visit. He reports that he did go to integrative therapy and that acupuncture and intervention helped him tremendously and he is no longer in pain there.  #6: Patient is working hard to lose weight.  #7: Return to clinic in 6 months   Orders: No orders of the defined types were placed in this encounter.  No orders of the defined types were placed in this encounter.   Face-to-face time spent with patient was 30 minutes. 50% of time was  spent in  counseling and coordination of care.  Follow-Up Instructions: No Follow-up on file.   Amy Littrell, RT  Note - This record has been created using [employer].  Chart creation errors have been sought, but may not always  have been located. Such creation errors do not reflect on  the standard of medical care.

## 2016-09-08 ENCOUNTER — Ambulatory Visit (INDEPENDENT_AMBULATORY_CARE_PROVIDER_SITE_OTHER): Payer: BLUE CROSS/BLUE SHIELD | Admitting: Rheumatology

## 2016-09-08 ENCOUNTER — Encounter: Payer: Self-pay | Admitting: Rheumatology

## 2016-09-08 VITALS — BP 130/81 | HR 61 | Resp 13 | Ht 71.0 in | Wt 251.0 lb

## 2016-09-08 DIAGNOSIS — M170 Bilateral primary osteoarthritis of knee: Secondary | ICD-10-CM

## 2016-09-08 DIAGNOSIS — Z85820 Personal history of malignant melanoma of skin: Secondary | ICD-10-CM | POA: Diagnosis not present

## 2016-09-08 DIAGNOSIS — Z87442 Personal history of urinary calculi: Secondary | ICD-10-CM

## 2016-09-08 DIAGNOSIS — M1A09X0 Idiopathic chronic gout, multiple sites, without tophus (tophi): Secondary | ICD-10-CM | POA: Diagnosis not present

## 2016-09-08 LAB — CBC WITH DIFFERENTIAL/PLATELET
BASOS ABS: 71 {cells}/uL (ref 0–200)
Basophils Relative: 1 %
Eosinophils Absolute: 142 cells/uL (ref 15–500)
Eosinophils Relative: 2 %
HEMATOCRIT: 50 % (ref 38.5–50.0)
Hemoglobin: 17.1 g/dL (ref 13.2–17.1)
Lymphocytes Relative: 32 %
Lymphs Abs: 2272 cells/uL (ref 850–3900)
MCH: 30.9 pg (ref 27.0–33.0)
MCHC: 34.2 g/dL (ref 32.0–36.0)
MCV: 90.4 fL (ref 80.0–100.0)
MONOS PCT: 9 %
MPV: 10.7 fL (ref 7.5–12.5)
Monocytes Absolute: 639 cells/uL (ref 200–950)
Neutro Abs: 3976 cells/uL (ref 1500–7800)
Neutrophils Relative %: 56 %
PLATELETS: 235 10*3/uL (ref 140–400)
RBC: 5.53 MIL/uL (ref 4.20–5.80)
RDW: 15.1 % — ABNORMAL HIGH (ref 11.0–15.0)
WBC: 7.1 10*3/uL (ref 3.8–10.8)

## 2016-09-08 LAB — COMPLETE METABOLIC PANEL WITH GFR
ALT: 26 U/L (ref 9–46)
AST: 18 U/L (ref 10–35)
Albumin: 4.3 g/dL (ref 3.6–5.1)
Alkaline Phosphatase: 74 U/L (ref 40–115)
BILIRUBIN TOTAL: 0.4 mg/dL (ref 0.2–1.2)
BUN: 12 mg/dL (ref 7–25)
CALCIUM: 10.1 mg/dL (ref 8.6–10.3)
CO2: 27 mmol/L (ref 20–31)
Chloride: 105 mmol/L (ref 98–110)
Creat: 1.14 mg/dL (ref 0.70–1.33)
GFR, EST AFRICAN AMERICAN: 83 mL/min (ref >=60)
GFR, EST NON AFRICAN AMERICAN: 71 mL/min (ref >=60)
Glucose, Bld: 94 mg/dL (ref 65–99)
Potassium: 5.1 mmol/L (ref 3.5–5.3)
Sodium: 141 mmol/L (ref 135–146)
Total Protein: 7.4 g/dL (ref 6.1–8.1)

## 2016-09-08 MED ORDER — DICLOFENAC SODIUM 1 % TD GEL: TRANSDERMAL | 3 refills | Status: DC

## 2016-09-08 MED ORDER — COLCHICINE 0.6 MG PO CAPS: 0.6000 mg | ORAL_CAPSULE | Freq: Every day | ORAL | 2 refills | Status: DC | PRN

## 2016-09-08 MED ORDER — ALLOPURINOL 300 MG PO TABS: 300.0000 mg | ORAL_TABLET | Freq: Every day | ORAL | 1 refills | Status: DC

## 2016-09-08 NOTE — Patient Instructions (Signed)
Gout Introduction Gout is painful swelling that can happen in some of your joints. Gout is a type of arthritis. This condition is caused by having too much uric acid in your body. Uric acid is a chemical that is made when your body breaks down substances called purines. If your body has too much uric acid, sharp crystals can form and build up in your joints. This causes pain and swelling. Gout attacks can happen quickly and be very painful (acute gout). Over time, the attacks can affect more joints and happen more often (chronic gout). Follow these instructions at home: During a Gout Attack  If directed, put ice on the painful area:  Put ice in a plastic bag.  Place a towel between your skin and the bag.  Leave the ice on for 20 minutes, 2-3 times a day.  Rest the joint as much as possible. If the joint is in your leg, you may be given crutches to use.  Raise (elevate) the painful joint above the level of your heart as often as you can.  Drink enough fluids to keep your pee (urine) clear or pale yellow.  Take over-the-counter and prescription medicines only as told by your doctor.  Do not drive or use heavy machinery while taking prescription pain medicine.  Follow instructions from your doctor about what you can or cannot eat and drink.  Return to your normal activities as told by your doctor. Ask your doctor what activities are safe for you. Avoiding Future Gout Attacks  Follow a low-purine diet as told by a specialist (dietitian) or your doctor. Avoid foods and drinks that have a lot of purines, such as:  Liver.  Kidney.  Anchovies.  Asparagus.  Herring.  Mushrooms  Mussels.  Beer.  Limit alcohol intake to no more than 1 drink a day for nonpregnant women and 2 drinks a day for men. One drink equals 12 oz of beer, 5 oz of wine, or 1 oz of hard liquor.  Stay at a healthy weight or lose weight if you are overweight. If you want to lose weight, talk with your doctor.  It is important that you do not lose weight too fast.  Start or continue an exercise plan as told by your doctor.  Drink enough fluids to keep your pee clear or pale yellow.  Take over-the-counter and prescription medicines only as told by your doctor.  Keep all follow-up visits as told by your doctor. This is important. Contact a doctor if:  You have another gout attack.  You still have symptoms of a gout attack after10 days of treatment.  You have problems (side effects) because of your medicines.  You have chills or a fever.  You have burning pain when you pee (urinate).  You have pain in your lower back or belly. Get help right away if:  You have very bad pain.  Your pain cannot be controlled.  You cannot pee. This information is not intended to replace advice given to you by your health care provider. Make sure you discuss any questions you have with your health care provider. Document Released: 04/22/2008 Document Revised: 12/20/2015 Document Reviewed: 04/26/2015  2017 [employer]vier

## 2016-09-09 LAB — URIC ACID: Uric Acid, Serum: 6 mg/dL (ref 4.0–8.0)

## 2017-06-29 ENCOUNTER — Other Ambulatory Visit: Payer: Self-pay | Admitting: Rheumatology

## 2017-06-30 NOTE — Telephone Encounter (Signed)
Last Visit : 09/08/16 Next Visit was due August 2018. Message sent to the front to schedule patient. Labs: 09/08/16 WNL  Patient advised he is due for labs and will come in this week to update labs.  Okay to refill 30 day supply per Dr. Estanislado Pandy

## 2017-07-31 ENCOUNTER — Other Ambulatory Visit: Payer: Self-pay | Admitting: Rheumatology

## 2017-07-31 NOTE — Telephone Encounter (Signed)
ok to give allopurinol 30 day supply. Please advised patient this will be the last refill. You may schedule him next week for a follow-up appointment.

## 2017-07-31 NOTE — Telephone Encounter (Signed)
Last Visit : 09/08/16 Next Visit was due August 2018. Message sent to the front to schedule patient. Labs: 09/08/16 WNL  Left message for patient that he is due for a follow up appointment and labs  Gave 30 supply of allopurinol in December as patient stated he would come for labs and did not follow through.  Okay to refill 30 day supply Allopurinol

## 2017-08-11 ENCOUNTER — Other Ambulatory Visit: Payer: Self-pay

## 2017-08-11 ENCOUNTER — Other Ambulatory Visit: Payer: Self-pay | Admitting: *Deleted

## 2017-08-11 DIAGNOSIS — M2550 Pain in unspecified joint: Secondary | ICD-10-CM

## 2017-08-11 DIAGNOSIS — Z79899 Other long term (current) drug therapy: Secondary | ICD-10-CM

## 2017-08-12 ENCOUNTER — Telehealth: Payer: Self-pay | Admitting: Rheumatology

## 2017-08-12 LAB — COMPLETE METABOLIC PANEL WITH GFR
AG RATIO: 1.6 (calc) (ref 1.0–2.5)
ALBUMIN MSPROF: 4.6 g/dL (ref 3.6–5.1)
ALKALINE PHOSPHATASE (APISO): 72 U/L (ref 40–115)
ALT: 26 U/L (ref 9–46)
AST: 15 U/L (ref 10–35)
BUN: 15 mg/dL (ref 7–25)
CO2: 29 mmol/L (ref 20–32)
CREATININE: 1.01 mg/dL (ref 0.70–1.33)
Calcium: 9.9 mg/dL (ref 8.6–10.3)
Chloride: 105 mmol/L (ref 98–110)
GFR, EST NON AFRICAN AMERICAN: 82 mL/min/{1.73_m2} (ref >=60)
GFR, Est African American: 95 mL/min/{1.73_m2} (ref >=60)
GLOBULIN: 2.8 g/dL (ref 1.9–3.7)
Glucose, Bld: 90 mg/dL (ref 65–99)
POTASSIUM: 4.6 mmol/L (ref 3.5–5.3)
SODIUM: 143 mmol/L (ref 135–146)
Total Bilirubin: 0.3 mg/dL (ref 0.2–1.2)
Total Protein: 7.4 g/dL (ref 6.1–8.1)

## 2017-08-12 LAB — CBC WITH DIFFERENTIAL/PLATELET
BASOS ABS: 74 {cells}/uL (ref 0–200)
BASOS PCT: 0.8 %
EOS ABS: 130 {cells}/uL (ref 15–500)
Eosinophils Relative: 1.4 %
HCT: 47.4 % (ref 38.5–50.0)
Hemoglobin: 16.6 g/dL (ref 13.2–17.1)
Lymphs Abs: 2418 cells/uL (ref 850–3900)
MCH: 30.7 pg (ref 27.0–33.0)
MCHC: 35 g/dL (ref 32.0–36.0)
MCV: 87.8 fL (ref 80.0–100.0)
MONOS PCT: 8.5 %
MPV: 11 fL (ref 7.5–12.5)
Neutro Abs: 5887 cells/uL (ref 1500–7800)
Neutrophils Relative %: 63.3 %
Platelets: 238 10*3/uL (ref 140–400)
RBC: 5.4 10*6/uL (ref 4.20–5.80)
RDW: 13.3 % (ref 11.0–15.0)
Total Lymphocyte: 26 %
WBC mixed population: 791 cells/uL (ref 200–950)
WBC: 9.3 10*3/uL (ref 3.8–10.8)

## 2017-08-12 LAB — URIC ACID: Uric Acid, Serum: 6.1 mg/dL (ref 4.0–8.0)

## 2017-08-12 NOTE — Progress Notes (Signed)
WNLs

## 2017-08-12 NOTE — Telephone Encounter (Signed)
-----   Message from Carole Binning, LPN sent at 16/12/628  9:30 AM EST ----- Regarding: Please schedule patient for a follow up visit. Please schedule patient for a follow up visit. Patient was due August 2018. Thanks!

## 2017-08-12 NOTE — Telephone Encounter (Signed)
I LMOM for patient to call, and schedule rov with Dr. Estanislado Pandy.

## 2017-08-13 ENCOUNTER — Encounter: Payer: Self-pay | Admitting: Internal Medicine

## 2017-08-28 DIAGNOSIS — M1711 Unilateral primary osteoarthritis, right knee: Secondary | ICD-10-CM | POA: Insufficient documentation

## 2017-09-02 ENCOUNTER — Other Ambulatory Visit: Payer: Self-pay | Admitting: Rheumatology

## 2017-10-16 ENCOUNTER — Other Ambulatory Visit: Payer: Self-pay

## 2017-10-16 ENCOUNTER — Encounter: Payer: Self-pay | Admitting: Internal Medicine

## 2017-10-16 ENCOUNTER — Ambulatory Visit (AMBULATORY_SURGERY_CENTER): Payer: Self-pay | Admitting: *Deleted

## 2017-10-16 VITALS — Ht 70.0 in | Wt 244.6 lb

## 2017-10-16 DIAGNOSIS — Z86010 Personal history of colonic polyps: Secondary | ICD-10-CM

## 2017-10-16 DIAGNOSIS — Z800 Family history of malignant neoplasm of digestive organs: Secondary | ICD-10-CM

## 2017-10-16 NOTE — Progress Notes (Signed)
No egg or soy allergy known to patient  No issues with past sedation with any surgeries  or procedures, no intubation problems  No diet pills per patient No home 02 use per patient  No blood thinners per patient  Pt denies issues with constipation  No A fib or A flutter  EMMI video sent to pt's e mail  Pt. declined 

## 2017-10-30 ENCOUNTER — Other Ambulatory Visit: Payer: Self-pay

## 2017-10-30 ENCOUNTER — Encounter: Payer: Self-pay | Admitting: Internal Medicine

## 2017-10-30 ENCOUNTER — Ambulatory Visit (AMBULATORY_SURGERY_CENTER): Payer: BLUE CROSS/BLUE SHIELD | Admitting: Internal Medicine

## 2017-10-30 VITALS — BP 129/90 | HR 55 | Temp 98.0°F | Resp 17 | Ht 70.0 in | Wt 244.0 lb

## 2017-10-30 DIAGNOSIS — D123 Benign neoplasm of transverse colon: Secondary | ICD-10-CM

## 2017-10-30 DIAGNOSIS — D124 Benign neoplasm of descending colon: Secondary | ICD-10-CM

## 2017-10-30 DIAGNOSIS — D122 Benign neoplasm of ascending colon: Secondary | ICD-10-CM

## 2017-10-30 DIAGNOSIS — Z86010 Personal history of colonic polyps: Secondary | ICD-10-CM | POA: Diagnosis present

## 2017-10-30 DIAGNOSIS — D128 Benign neoplasm of rectum: Secondary | ICD-10-CM

## 2017-10-30 DIAGNOSIS — D120 Benign neoplasm of cecum: Secondary | ICD-10-CM | POA: Diagnosis not present

## 2017-10-30 DIAGNOSIS — D125 Benign neoplasm of sigmoid colon: Secondary | ICD-10-CM

## 2017-10-30 DIAGNOSIS — D129 Benign neoplasm of anus and anal canal: Secondary | ICD-10-CM

## 2017-10-30 MED ORDER — SODIUM CHLORIDE 0.9 % IV SOLN: 500.0000 mL | Freq: Once | INTRAVENOUS | Status: DC

## 2017-10-30 NOTE — Op Note (Signed)
[place] [place] Patient Name: Treysen Sudbeck Procedure Date: 10/30/2017 9:27 AM MRN: [id number] Endoscopist: Gatha Mayer , MD Age: 57 Referring MD:  Date of Birth: Mar 10, 1961 Gender: Male Account #: [id number] Procedure:                Colonoscopy Indications:              Surveillance: Personal history of adenomatous                            polyps on last colonoscopy > 5 years ago and FHx                            CRCA mom Medicines:                Propofol per Anesthesia, Monitored Anesthesia Care Procedure:                Pre-Anesthesia Assessment:                           - Prior to the procedure, a History and Physical                            was performed, and patient medications and                            allergies were reviewed. The patient's tolerance of                            previous anesthesia was also reviewed. The risks                            and benefits of the procedure and the sedation                            options and risks were discussed with the patient.                            All questions were answered, and informed consent                            was obtained. Prior Anticoagulants: The patient has                            taken no previous anticoagulant or antiplatelet                            agents. ASA Grade Assessment: II - A patient with                            mild systemic disease. After reviewing the risks                            and benefits, the patient was deemed in  satisfactory condition to undergo the procedure.                           After obtaining informed consent, the colonoscope                            was passed under direct vision. Throughout the                            procedure, the patient's blood pressure, pulse, and                            oxygen saturations were monitored continuously. The                            Colonoscope was introduced through  the anus and                            advanced to the the cecum, identified by                            appendiceal orifice and ileocecal valve. The                            colonoscopy was performed without difficulty. The                            patient tolerated the procedure well. The quality                            of the bowel preparation was good. The ileocecal                            valve, appendiceal orifice, and rectum were                            photographed. The bowel preparation used was                            Miralax. Scope In: 9:28:54 AM Scope Out: 9:53:56 AM Scope Withdrawal Time: 0 hours 22 minutes 40 seconds  Total Procedure Duration: 0 hours 25 minutes 2 seconds  Findings:                 The perianal and digital rectal examinations were                            normal. Pertinent negatives include normal prostate                            (size, shape, and consistency).                           A 8 mm polyp was found in the sigmoid colon. The  polyp was pedunculated. The polyp was removed with                            a hot snare. Resection and retrieval were complete.                            Verification of patient identification for the                            specimen was done. Estimated blood loss was                            minimal. To prevent bleeding after the polypectomy,                            one hemostatic clip was successfully placed (MR                            conditional). There was no bleeding at the end of                            the procedure.                           Five sessile polyps were found in the rectum,                            sigmoid colon, descending colon, transverse colon                            and ascending colon. The polyps were diminutive in                            size. These polyps were removed with a cold snare.                            Resection  and retrieval were complete. Verification                            of patient identification for the specimen was                            done. Estimated blood loss was minimal.                           Two sessile polyps were found in the descending                            colon and cecum. The polyps were 1 to 2 mm in size.                            These polyps were removed with a cold biopsy  forceps. Resection and retrieval were complete.                            Verification of patient identification for the                            specimen was done. Estimated blood loss was minimal.                           A few small-mouthed diverticula were found in the                            sigmoid colon.                           Internal hemorrhoids were found during retroflexion.                           The exam was otherwise without abnormality on                            direct and retroflexion views. Complications:            No immediate complications. Estimated Blood Loss:     Estimated blood loss was minimal. Impression:               - One 8 mm polyp in the sigmoid colon, removed with                            a hot snare. Resected and retrieved. Clip (MR                            conditional) was placed.                           - Five diminutive polyps in the rectum, in the                            sigmoid colon, in the descending colon, in the                            transverse colon and in the ascending colon,                            removed with a cold snare. Resected and retrieved.                           - Two 1 to 2 mm polyps in the descending colon and                            in the cecum, removed with a cold biopsy forceps.                            Resected and retrieved.                           -  Diverticulosis in the sigmoid colon.                           - Internal hemorrhoids.                            - The examination was otherwise normal on direct                            and retroflexion views.                           - Personal history of colonic polyps. and mother                            had colon cancer Recommendation:           - Patient has a contact number available for                            emergencies. The signs and symptoms of potential                            delayed complications were discussed with the                            patient. Return to normal activities tomorrow.                            Written discharge instructions were provided to the                            patient.                           - Resume previous diet.                           - Continue present medications.                           - No aspirin, ibuprofen, naproxen, or other                            non-steroidal anti-inflammatory drugs for 2 weeks                            after polyp removal. Gatha Mayer, MD 10/30/2017 10:09:35 AM This report has been signed electronically.

## 2017-10-30 NOTE — Progress Notes (Signed)
Called to room to assist during endoscopic procedure.  Patient ID and intended procedure confirmed with present staff. Received instructions for my participation in the procedure from the performing physician.  

## 2017-10-30 NOTE — Progress Notes (Signed)
Pt's states no medical or surgical changes since previsit or office visit. 

## 2017-10-30 NOTE — Patient Instructions (Addendum)
I found and removed 8 polyps today - all look benign.  Still some hemorrhoid enlargement - could be from the prep.  Also some small diverticulosis.  I will let you know pathology results and when to have another routine colonoscopy by mail and/or My Chart.  I appreciate the opportunity to care for you. Gatha Mayer, MD, [facility]   Handouts given : Quick Clip Card.  Polyps, Hemorrhoid and Diverticulosis Handouts given.  YOU HAD AN ENDOSCOPIC PROCEDURE TODAY AT [facility]:   Refer to the procedure report that was given to you for any specific questions about what was found during the examination.  If the procedure report does not answer your questions, please call your gastroenterologist to clarify.  If you requested that your care partner not be given the details of your procedure findings, then the procedure report has been included in a sealed envelope for you to review at your convenience later.  YOU SHOULD EXPECT: Some feelings of bloating in the abdomen. Passage of more gas than usual.  Walking can help get rid of the air that was put into your GI tract during the procedure and reduce the bloating. If you had a lower endoscopy (such as a colonoscopy or flexible sigmoidoscopy) you may notice spotting of blood in your stool or on the toilet paper. If you underwent a bowel prep for your procedure, you may not have a normal bowel movement for a few days.  Please Note:  You might notice some irritation and congestion in your nose or some drainage.  This is from the oxygen used during your procedure.  There is no need for concern and it should clear up in a day or so.  SYMPTOMS TO REPORT IMMEDIATELY:   Following lower endoscopy (colonoscopy or flexible sigmoidoscopy):  Excessive amounts of blood in the stool  Significant tenderness or worsening of abdominal pains  Swelling of the abdomen that is new, acute  Fever of 100F or higher   For urgent or emergent  issues, a gastroenterologist can be reached at any hour by calling [phone].   DIET:  We do recommend a small meal at first, but then you may proceed to your regular diet.  Drink plenty of fluids but you should avoid alcoholic beverages for 24 hours.  ACTIVITY:  You should plan to take it easy for the rest of today and you should NOT DRIVE or use heavy machinery until tomorrow (because of the sedation medicines used during the test).    FOLLOW UP: Our staff will call the number listed on your records the next business day following your procedure to check on you and address any questions or concerns that you may have regarding the information given to you following your procedure. If we do not reach you, we will leave a message.  However, if you are feeling well and you are not experiencing any problems, there is no need to return our call.  We will assume that you have returned to your regular daily activities without incident.  If any biopsies were taken you will be contacted by phone or by letter within the next 1-3 weeks.  Please call us at [phone] if you have not heard about the biopsies in 3 weeks.    SIGNATURES/CONFIDENTIALITY: You and/or your care partner have signed paperwork which will be entered into your electronic medical record.  These signatures attest to the fact that that the information above on your After Visit  Summary has been reviewed and is understood.  Full responsibility of the confidentiality of this discharge information lies with you and/or your care-partner. 

## 2017-10-30 NOTE — Progress Notes (Signed)
Report to PACU, RN, vss, BBS= Clear.  

## 2017-11-02 ENCOUNTER — Telehealth: Payer: Self-pay

## 2017-11-02 NOTE — Telephone Encounter (Signed)
  Follow up Call-  Call back number 10/30/2017  Post procedure Call Back phone  # [phone]  Permission to leave phone message Yes  Some recent data might be hidden     Patient questions:  Do you have a fever, pain , or abdominal swelling? No. Pain Score  0 *  Have you tolerated food without any problems? No.  Have you been able to return to your normal activities? Yes.    Do you have any questions about your discharge instructions: Diet   No. Medications  No. Follow up visit  No.  Do you have questions or concerns about your Care? No.  Actions: * If pain score is 4 or above: No action needed, pain <4.

## 2017-11-02 NOTE — Telephone Encounter (Signed)
Left message

## 2017-11-10 ENCOUNTER — Encounter: Payer: Self-pay | Admitting: Internal Medicine

## 2017-11-10 DIAGNOSIS — Z86010 Personal history of colonic polyps: Secondary | ICD-10-CM

## 2017-11-10 NOTE — Progress Notes (Signed)
5 adenomas  Recall colon 2022

## 2018-03-23 ENCOUNTER — Telehealth: Payer: Self-pay | Admitting: Rheumatology

## 2018-03-23 NOTE — Telephone Encounter (Signed)
Patient has not been seen since 08/2016. Patient needs follow up appointment and labs prior to refill. Left message to advise patient.

## 2018-03-23 NOTE — Telephone Encounter (Signed)
Patient needs refill on [facility] [place].

## 2018-03-25 NOTE — Progress Notes (Signed)
Office Visit Note  Patient: Dennis Oliver             Date of Birth: May 27, 1961           MRN: [id number]             PCP: Patient, No Pcp Per Referring: No ref. provider found Visit Date: 04/02/2018 Occupation: @GUAROCC @  Subjective:  Intermittent right great toe pain   History of Present Illness: Dennis Oliver is a 57 y.o. male with history of gout and osteoarthritis.  He is taking Allopurinol 150 mg po daily and Colchicine 0.6 mg po PRN. He reports he has occasional discomfort in the right great toe.  He reports tenderness but no warmth, redness, or swelling.  He states he is asymptomatic at this time.  He states he has occasional discomfort in the right knee joints, and he recently had gel visco injections which have provided pain relief.  He states he has rarely drinks beer and tries to avoid trigger foods. He denies any nodules or tophi present.  Activities of Daily Living:  Patient reports morning stiffness for 0 minutes.   Patient Denies nocturnal pain.  Difficulty dressing/grooming: Denies Difficulty climbing stairs: Denies Difficulty getting out of chair: Denies Difficulty using hands for taps, buttons, cutlery, and/or writing: Denies  Review of Systems  Constitutional: Negative for fatigue and night sweats.  HENT: Negative for mouth sores, mouth dryness and nose dryness.   Eyes: Negative for redness, visual disturbance and dryness.  Respiratory: Negative for cough, hemoptysis, shortness of breath and difficulty breathing.   Cardiovascular: Negative for chest pain, palpitations, hypertension, irregular heartbeat and swelling in legs/feet.  Gastrointestinal: Negative for blood in stool, constipation and diarrhea.  Endocrine: Negative for increased urination.  Genitourinary: Negative for painful urination.  Musculoskeletal: Positive for arthralgias and joint pain. Negative for joint swelling, myalgias, muscle weakness, morning stiffness, muscle tenderness and  myalgias.  Skin: Negative for color change, rash, hair loss, nodules/bumps, skin tightness, ulcers and sensitivity to sunlight.  Allergic/Immunologic: Negative for susceptible to infections.  Neurological: Negative for dizziness, fainting, memory loss, night sweats and weakness.  Hematological: Negative for swollen glands.  Psychiatric/Behavioral: Negative for depressed mood and sleep disturbance. The patient is not nervous/anxious.     PMFS History:  Patient Active Problem List   Diagnosis Date Noted  . Idiopathic chronic gout of multiple sites without tophus 09/04/2016  . Primary osteoarthritis of both knees 09/04/2016  . Anal skin tag 01/08/2015  . Gout 10/29/2010  . Asthma 10/29/2010  . Allergic rhinitis 10/29/2010  . Nephrolithiasis 10/29/2010  . Hemorrhoids, internal, with bleeding 10/29/2010  . ASTHMA 03/26/2009  . Hx of adenomatous colonic polyps + FHx CRCA 10/17/1999    Past Medical History:  Diagnosis Date  . Allergy   . Anemia   . Asthma   . Blood transfusion without reported diagnosis   . Cancer ([place])    melanoma back  . DISLOCATION CLOSED INTERPHALANGEAL HAND 03/26/2009   Qualifier: Diagnosis of  By: Aline Brochure MD, Dorothyann Peng    . Diverticulosis   . H/O: gout   . Hx of adenomatous colonic polyps 10/17/1999  . Internal hemorrhoids   . Kidney stone   . Septic arthritis of knee, right ([place]) 10/29/2010  . Tubular adenoma     Family History  Problem Relation Age of Onset  . Colon cancer Mother 7  . Colon polyps Neg Hx   . Esophageal cancer Neg Hx   . Rectal cancer Neg Hx   .  Stomach cancer Neg Hx    Past Surgical History:  Procedure Laterality Date  . COLONOSCOPY    . HEMORRHOID BANDING  2016  . KIDNEY STONE SURGERY    . KNEE ARTHROSCOPY  FEB THEN 10/01/2010   right knee; HAD INFECTION REPEAT ARTHROSCOPY  . KNEE ARTHROSCOPY W/ ACL RECONSTRUCTION  20 YEARS AGO  . VASECTOMY  2016   Social History   Social History Narrative  . Not on file     Objective: Vital Signs: BP 115/77 (BP Location: Right Arm, Patient Position: Sitting, Cuff Size: Normal)   Pulse 63   Resp 14   Ht 5\' 10"  (1.778 m)   Wt 238 lb (108 kg)   BMI 34.15 kg/m    Physical Exam  Constitutional: He is oriented to person, place, and time. He appears well-developed and well-nourished.  HENT:  Head: Normocephalic and atraumatic.  Eyes: Pupils are equal, round, and reactive to light. Conjunctivae and EOM are normal.  Neck: Normal range of motion. Neck supple.  Cardiovascular: Normal rate, regular rhythm and normal heart sounds.  Pulmonary/Chest: Effort normal and breath sounds normal.  Abdominal: Soft. Bowel sounds are normal.  Lymphadenopathy:    He has no cervical adenopathy.  Neurological: He is alert and oriented to person, place, and time.  Skin: Skin is warm and dry. Capillary refill takes less than 2 seconds.  Psychiatric: He has a normal mood and affect. His behavior is normal.  Nursing note and vitals reviewed.    Musculoskeletal Exam: C-spine, thoracic spine, and lumbar spine good ROM.  No midline spinal tenderness.  No SI joint tenderness.  Shoulder joints, elbow joints, wrist joints, MCPs, PIPs, and DIPs good ROM with no synovitis.  Hip joints, ankle joints, MTPs, PIPs, and DIPs good ROM with no synovitis. Right knee limited extension. No warmth or effusion of knee joints.  No MTP joint tenderness or inflammation noted.  CDAI Exam: CDAI Score: Not documented Patient Global Assessment: Not documented; Provider Global Assessment: Not documented Swollen: Not documented; Tender: Not documented Joint Exam   Not documented   There is currently no information documented on the homunculus. Go to the Rheumatology activity and complete the homunculus joint exam.  Investigation: No additional findings.  Imaging: No results found.  Recent Labs: Lab Results  Component Value Date   WBC 9.3 08/11/2017   HGB 16.6 08/11/2017   PLT 238 08/11/2017    NA 143 08/11/2017   K 4.6 08/11/2017   CL 105 08/11/2017   CO2 29 08/11/2017   GLUCOSE 90 08/11/2017   BUN 15 08/11/2017   CREATININE 1.01 08/11/2017   BILITOT 0.3 08/11/2017   ALKPHOS 74 09/08/2016   AST 15 08/11/2017   ALT 26 08/11/2017   PROT 7.4 08/11/2017   ALBUMIN 4.3 09/08/2016   CALCIUM 9.9 08/11/2017   GFRAA 95 08/11/2017    Speciality Comments: No specialty comments available.  Procedures:  No procedures performed Allergies: Shellfish allergy   Assessment / Plan:     Visit Diagnoses: Idiopathic chronic gout of multiple sites without tophus -He has not had any recent gout flares.  He has intermittent tenderness of the right great toe.  No tenderness, warmth, or erythema was noted on exam today. He has been compliant with taking Allopurinol 150 mg po daily. His uric acid on 08/11/2017 was 6.1.  We discussed increasing the dose of allopurinol to 300 mg po daily since he has been having increased intermittent right great toe pain.  Ideally, his uric acid  should be less than 6.  A prescription for allopurinol and colchicine were sent to the pharmacy.  He was also given a copay card for mitigare.  He was encouraged to avoid trigger foods and beer.   He will follow up in 1 month to check uric acid level.  He was advised to notify us if he develops increased joint pain or joint swelling.   - Plan: Uric acid  Medication monitoring encounter - CBC and CMP will be checked in 1 month to moinitor for drug toxicity.  Future orders were placed today. Plan: CBC with Differential/Platelet, COMPLETE METABOLIC PANEL WITH GFR  Primary osteoarthritis of both knees: No warmth or effusion noted.  He has limited extension of right knee joint.  He has occasional right knee discomfort and stiffness.  He recently had visco gel injections, which provided pain relief.   Other medical conditions are listed as follows:   History of asthma  History of melanoma  History of renal  calculi   Orders: Orders Placed This Encounter  Procedures  . CBC with Differential/Platelet  . COMPLETE METABOLIC PANEL WITH GFR  . Uric acid   Meds ordered this encounter  Medications  . allopurinol (ZYLOPRIM) 300 MG tablet    Sig: Take 1 tablet (300 mg total) by mouth daily.    Dispense:  90 tablet    Refill:  1  . Colchicine (MITIGARE) 0.6 MG CAPS    Sig: Take 0.6 mg by mouth daily as needed.    Dispense:  30 capsule    Refill:  2      Follow-Up Instructions: Return in about 6 months (around 10/01/2018) for Gout, Osteoarthritis.   Ofilia Neas, PA-C   I examined and evaluated the patient with Hazel Sams PA.  Patient continues to have intermittent twinges arthralgias.  His last uric acid level was 6.1.  I detailed discussion with patient regarding the management of gout.  I would increase his allopurinol to 300 mg p.o. daily and recheck his labs in a month after that.  He was in agreement.  The plan of care was discussed as noted above.  Bo Merino, MD  Note - This record has been created using Editor, commissioning.  Chart creation errors have been sought, but may not always  have been located. Such creation errors do not reflect on  the standard of medical care.

## 2018-04-02 ENCOUNTER — Ambulatory Visit: Payer: BLUE CROSS/BLUE SHIELD | Admitting: Rheumatology

## 2018-04-02 ENCOUNTER — Other Ambulatory Visit: Payer: Self-pay | Admitting: Physician Assistant

## 2018-04-02 ENCOUNTER — Encounter: Payer: Self-pay | Admitting: Rheumatology

## 2018-04-02 VITALS — BP 115/77 | HR 63 | Resp 14 | Ht 70.0 in | Wt 238.0 lb

## 2018-04-02 DIAGNOSIS — M1A09X0 Idiopathic chronic gout, multiple sites, without tophus (tophi): Secondary | ICD-10-CM

## 2018-04-02 DIAGNOSIS — M170 Bilateral primary osteoarthritis of knee: Secondary | ICD-10-CM | POA: Diagnosis not present

## 2018-04-02 DIAGNOSIS — Z8709 Personal history of other diseases of the respiratory system: Secondary | ICD-10-CM | POA: Diagnosis not present

## 2018-04-02 DIAGNOSIS — Z85820 Personal history of malignant melanoma of skin: Secondary | ICD-10-CM | POA: Diagnosis not present

## 2018-04-02 DIAGNOSIS — Z5181 Encounter for therapeutic drug level monitoring: Secondary | ICD-10-CM

## 2018-04-02 DIAGNOSIS — Z87442 Personal history of urinary calculi: Secondary | ICD-10-CM

## 2018-04-02 MED ORDER — ALLOPURINOL 300 MG PO TABS: 300.0000 mg | ORAL_TABLET | Freq: Every day | ORAL | 1 refills | Status: DC

## 2018-04-02 MED ORDER — COLCHICINE 0.6 MG PO CAPS: 0.6000 mg | ORAL_CAPSULE | Freq: Every day | ORAL | 2 refills | Status: DC | PRN

## 2018-04-02 NOTE — Patient Instructions (Signed)
Standing Labs We placed an order today for your standing lab work.    Please come back and get your standing labs in 1 month   Uric acid, CBC, and CMP   We have open lab Monday through Friday from 8:30-11:30 AM and 1:30-4:00 PM  at the office of Dr. Bo Merino.   You may experience shorter wait times on Monday and Friday afternoons. The office is located at [address] [place], [place], [place] [address] No appointment is necessary.   Labs are drawn by [facility].  You may receive a bill from [facility] for your lab work. If you have any questions regarding directions or hours of operation,  please call [phone].

## 2018-04-02 NOTE — Telephone Encounter (Signed)
Ok to send in tablets.

## 2018-05-24 ENCOUNTER — Other Ambulatory Visit: Payer: Self-pay

## 2018-05-24 DIAGNOSIS — Z5181 Encounter for therapeutic drug level monitoring: Secondary | ICD-10-CM

## 2018-05-24 DIAGNOSIS — M1A09X0 Idiopathic chronic gout, multiple sites, without tophus (tophi): Secondary | ICD-10-CM

## 2018-05-25 LAB — COMPLETE METABOLIC PANEL WITH GFR
AG Ratio: 1.7 (calc) (ref 1.0–2.5)
ALT: 26 U/L (ref 9–46)
AST: 20 U/L (ref 10–35)
Albumin: 4.3 g/dL (ref 3.6–5.1)
Alkaline phosphatase (APISO): 77 U/L (ref 40–115)
BUN: 15 mg/dL (ref 7–25)
CALCIUM: 9.7 mg/dL (ref 8.6–10.3)
CO2: 29 mmol/L (ref 20–32)
CREATININE: 1.05 mg/dL (ref 0.70–1.33)
Chloride: 105 mmol/L (ref 98–110)
GFR, EST NON AFRICAN AMERICAN: 78 mL/min/{1.73_m2} (ref >=60)
GFR, Est African American: 90 mL/min/{1.73_m2} (ref >=60)
GLOBULIN: 2.5 g/dL (ref 1.9–3.7)
GLUCOSE: 85 mg/dL (ref 65–99)
Potassium: 4.4 mmol/L (ref 3.5–5.3)
Sodium: 140 mmol/L (ref 135–146)
Total Bilirubin: 0.4 mg/dL (ref 0.2–1.2)
Total Protein: 6.8 g/dL (ref 6.1–8.1)

## 2018-05-25 LAB — URIC ACID: URIC ACID, SERUM: 5.3 mg/dL (ref 4.0–8.0)

## 2018-05-25 LAB — CBC WITH DIFFERENTIAL/PLATELET
BASOS PCT: 0.7 %
Basophils Absolute: 57 cells/uL (ref 0–200)
EOS ABS: 178 {cells}/uL (ref 15–500)
EOS PCT: 2.2 %
HCT: 45.6 % (ref 38.5–50.0)
Hemoglobin: 16.1 g/dL (ref 13.2–17.1)
Lymphs Abs: 2187 cells/uL (ref 850–3900)
MCH: 30.8 pg (ref 27.0–33.0)
MCHC: 35.3 g/dL (ref 32.0–36.0)
MCV: 87.2 fL (ref 80.0–100.0)
MONOS PCT: 7.3 %
MPV: 10.7 fL (ref 7.5–12.5)
NEUTROS ABS: 5087 {cells}/uL (ref 1500–7800)
Neutrophils Relative %: 62.8 %
PLATELETS: 220 10*3/uL (ref 140–400)
RBC: 5.23 10*6/uL (ref 4.20–5.80)
RDW: 13.1 % (ref 11.0–15.0)
Total Lymphocyte: 27 %
WBC mixed population: 591 cells/uL (ref 200–950)
WBC: 8.1 10*3/uL (ref 3.8–10.8)

## 2018-05-25 NOTE — Progress Notes (Signed)
Uric acid within desirable range.  CBC and CMP WNL.

## 2018-09-24 ENCOUNTER — Other Ambulatory Visit: Payer: Self-pay | Admitting: Physician Assistant

## 2018-09-27 NOTE — Telephone Encounter (Signed)
LMOM for patient to call and schedule follow-up appointment.   °

## 2018-09-27 NOTE — Telephone Encounter (Signed)
Please schedule patient for a follow up visit. Patient due March 2020 Thanks!  

## 2018-09-27 NOTE — Telephone Encounter (Signed)
Last Visit: 04/03/19 Next Visit sue March 2020. Message sent to front to schedule patient.  Labs: 05/24/18 Uric acid within desirable range. CBC and CMP WNL.  Okay to refill per Dr. Estanislado Pandy

## 2018-10-15 ENCOUNTER — Telehealth: Payer: Self-pay | Admitting: Rheumatology

## 2018-10-15 MED ORDER — ALLOPURINOL 300 MG PO TABS: 300.0000 mg | ORAL_TABLET | Freq: Every day | ORAL | 0 refills | Status: DC

## 2018-10-15 NOTE — Telephone Encounter (Signed)
Patient called requesting a prescription refill of [facility] [place] [facility] [place].  Patient states he is due for labs, but has asthma so would prefer to wait until next month to be safe.

## 2018-10-15 NOTE — Telephone Encounter (Signed)
Last Visit: 04/03/19 Next Visit sue March 2020. Message sent to front to schedule patient.  Labs: 05/24/18 Uric acid within desirable range. CBC and CMP WNL.  Okay to refill per Dr. Estanislado Pandy

## 2018-10-25 ENCOUNTER — Other Ambulatory Visit: Payer: Self-pay | Admitting: Physician Assistant

## 2018-10-25 NOTE — Progress Notes (Signed)
Virtual Visit via Telephone Note  I connected with Dennis Oliver on 10/27/18 at  8:15 AM EDT by telephone and verified that I am speaking with the correct person using two identifiers.   I discussed the limitations, risks, security and privacy concerns of performing an evaluation and management service by telephone and the availability of in person appointments. I also discussed with the patient that there may be a patient responsible charge related to this service. The patient expressed understanding and agreed to proceed.  CC: Medication monitoring   History of Present Illness: Patient is a 58 year old male with a past medical history of gout and osteoarthritis.  He is taking allopurinol 300 mg po daily.  He has not needed to take colchicine since his last visit. He denies any gout flares.  He denies any joint pain or joint swelling at this time.  He denies any knee joint pain. He has no concerns at this time.  He does not need any refills.   Review of Systems  Constitutional: Negative for fever and malaise/fatigue.  Eyes: Negative for photophobia, pain and redness.  Respiratory: Negative for cough, shortness of breath and wheezing.   Cardiovascular: Negative for chest pain and palpitations.  Gastrointestinal: Negative for abdominal pain, constipation and diarrhea.  Genitourinary: Negative for dysuria.  Musculoskeletal: Negative for back pain, joint pain, myalgias and neck pain.  Skin: Negative for rash.  Neurological: Negative for dizziness and headaches.  Psychiatric/Behavioral: Negative for depression. The patient is not nervous/anxious and does not have insomnia.      Patient reports morning stiffness for 0 minutes.   Patient denies nocturnal pain.  Difficulty dressing/grooming: Denies Difficulty climbing stairs: Denies Difficulty getting out of chair: Denies Difficulty using hands for taps, buttons, cutlery, and/or writing: Denies   Observations/Objective: Physical Exam   Constitutional: He is oriented to person, place, and time.  Neurological: He is alert and oriented to person, place, and time.  Psychiatric: Memory, affect and judgment normal.  He has complete fist formation bilaterally.   He denies joint swelling.   Assessment and Plan: Idiopathic chronic gout of multiple sites without tophus -He has not had any recent gout flares.  He has no joint pain or joint swelling.  He has no morning stiffness.  He has no difficulty with ADLs.  He is clinically doing well on allopurinol 300 mg po daily.  He takes colchicine 0.6 mg po PRN for gout flares. A refill of allopurinol was sent to the pharmacy on 10/25/18.  Uric acid was 5.3 on 10//28/19.  Future order for uric acid was placed today.  He is due for lab work this month.  He was advised to notify us if he develops signs or symptoms of a gout flare.  He will follow up in 6 months.   Medication monitoring encounter - He is taking Allopurinol 300 mg po daily.  Future orders for CBC, CMP, and uric acid were placed today.   Primary osteoarthritis of both knees: He has no knee joint pain or joint swelling.  He has no difficulty going up and down steps or getting up from a chair.    Other medical conditions are listed as follows:   History of asthma  History of melanoma  History of renal calculi  Follow Up Instructions: He will follow up in 6 months.   He does not need any refills.  Future orders for CBC, CMP, and uric acid will be placed today.    I discussed  the assessment and treatment plan with the patient. The patient was provided an opportunity to ask questions and all were answered. The patient agreed with the plan and demonstrated an understanding of the instructions.   The patient was advised to call back or seek an in-person evaluation if the symptoms worsen or if the condition fails to improve as anticipated.  I provided 12 minutes of non-face-to-face time during this encounter.  Bo Merino, MD  Scribed by- Ofilia Neas, PA-C

## 2018-10-25 NOTE — Telephone Encounter (Signed)
Please schedule patient for a follow up visit. Patient due March 2020 Thanks!  

## 2018-10-25 NOTE — Telephone Encounter (Signed)
Last Visit: 04/03/19 Next Visit due March 2020. Message sent to front to schedule patient.  Labs: 05/24/18 Uric acid within desirable range. CBC and CMP WNL.  Okay to refill per Dr. Estanislado Pandy

## 2018-10-27 ENCOUNTER — Encounter: Payer: Self-pay | Admitting: Rheumatology

## 2018-10-27 ENCOUNTER — Telehealth (INDEPENDENT_AMBULATORY_CARE_PROVIDER_SITE_OTHER): Payer: BLUE CROSS/BLUE SHIELD | Admitting: Rheumatology

## 2018-10-27 ENCOUNTER — Other Ambulatory Visit: Payer: Self-pay

## 2018-10-27 DIAGNOSIS — Z5181 Encounter for therapeutic drug level monitoring: Secondary | ICD-10-CM | POA: Diagnosis not present

## 2018-10-27 DIAGNOSIS — Z8709 Personal history of other diseases of the respiratory system: Secondary | ICD-10-CM

## 2018-10-27 DIAGNOSIS — M1A09X0 Idiopathic chronic gout, multiple sites, without tophus (tophi): Secondary | ICD-10-CM

## 2018-10-27 DIAGNOSIS — Z85820 Personal history of malignant melanoma of skin: Secondary | ICD-10-CM

## 2018-10-27 DIAGNOSIS — M170 Bilateral primary osteoarthritis of knee: Secondary | ICD-10-CM

## 2018-10-27 DIAGNOSIS — Z87442 Personal history of urinary calculi: Secondary | ICD-10-CM

## 2019-01-25 ENCOUNTER — Other Ambulatory Visit: Payer: Self-pay

## 2019-01-25 ENCOUNTER — Other Ambulatory Visit: Payer: Self-pay | Admitting: Rheumatology

## 2019-01-25 DIAGNOSIS — Z5181 Encounter for therapeutic drug level monitoring: Secondary | ICD-10-CM

## 2019-01-25 DIAGNOSIS — M1A09X0 Idiopathic chronic gout, multiple sites, without tophus (tophi): Secondary | ICD-10-CM

## 2019-01-25 NOTE — Telephone Encounter (Signed)
Patient came to office to update labs today.  Okay to refill per Dr. Estanislado Pandy

## 2019-01-25 NOTE — Telephone Encounter (Signed)
Last Visit: 10/27/18 Next Visit: 05/02/19 Labs: 05/25/19 Uric acid within desirable range. CBC and CMP WNL.  Patient advised he is due for labs. Patient to update today.

## 2019-01-26 LAB — COMPLETE METABOLIC PANEL WITH GFR
AG Ratio: 1.7 (calc) (ref 1.0–2.5)
ALT: 23 U/L (ref 9–46)
AST: 19 U/L (ref 10–35)
Albumin: 4.6 g/dL (ref 3.6–5.1)
Alkaline phosphatase (APISO): 72 U/L (ref 35–144)
BUN: 15 mg/dL (ref 7–25)
CO2: 26 mmol/L (ref 20–32)
Calcium: 10 mg/dL (ref 8.6–10.3)
Chloride: 108 mmol/L (ref 98–110)
Creat: 0.96 mg/dL (ref 0.70–1.33)
GFR, Est African American: 100 mL/min/{1.73_m2} (ref >=60)
GFR, Est Non African American: 86 mL/min/{1.73_m2} (ref >=60)
Globulin: 2.7 g/dL (calc) (ref 1.9–3.7)
Glucose, Bld: 85 mg/dL (ref 65–99)
Potassium: 4.8 mmol/L (ref 3.5–5.3)
Sodium: 143 mmol/L (ref 135–146)
Total Bilirubin: 0.4 mg/dL (ref 0.2–1.2)
Total Protein: 7.3 g/dL (ref 6.1–8.1)

## 2019-01-26 LAB — CBC WITH DIFFERENTIAL/PLATELET
Absolute Monocytes: 655 cells/uL (ref 200–950)
Basophils Absolute: 82 cells/uL (ref 0–200)
Basophils Relative: 0.9 %
Eosinophils Absolute: 164 cells/uL (ref 15–500)
Eosinophils Relative: 1.8 %
HCT: 45.9 % (ref 38.5–50.0)
Hemoglobin: 16 g/dL (ref 13.2–17.1)
Lymphs Abs: 2348 cells/uL (ref 850–3900)
MCH: 31.2 pg (ref 27.0–33.0)
MCHC: 34.9 g/dL (ref 32.0–36.0)
MCV: 89.5 fL (ref 80.0–100.0)
MPV: 11.2 fL (ref 7.5–12.5)
Monocytes Relative: 7.2 %
Neutro Abs: 5851 cells/uL (ref 1500–7800)
Neutrophils Relative %: 64.3 %
Platelets: 219 10*3/uL (ref 140–400)
RBC: 5.13 10*6/uL (ref 4.20–5.80)
RDW: 13.2 % (ref 11.0–15.0)
Total Lymphocyte: 25.8 %
WBC: 9.1 10*3/uL (ref 3.8–10.8)

## 2019-01-26 LAB — LIPID PANEL
Cholesterol: 240 mg/dL — ABNORMAL HIGH (ref <200)
HDL: 44 mg/dL (ref >=40)
LDL Cholesterol (Calc): 168 mg/dL (calc) — ABNORMAL HIGH
Non-HDL Cholesterol (Calc): 196 mg/dL (calc) — ABNORMAL HIGH (ref <130)
Total CHOL/HDL Ratio: 5.5 (calc) — ABNORMAL HIGH (ref <5.0)
Triglycerides: 141 mg/dL (ref <150)

## 2019-01-26 LAB — URIC ACID: Uric Acid, Serum: 5 mg/dL (ref 4.0–8.0)

## 2019-01-26 NOTE — Progress Notes (Signed)
CBC and CMP are normal.

## 2019-01-26 NOTE — Progress Notes (Signed)
LDL elevated. Please fax results to his PCP.

## 2019-04-18 NOTE — Progress Notes (Deleted)
Office Visit Note  Patient: Dennis Oliver             Date of Birth: 23-Jan-1961           MRN: [id number]             PCP: Patient, No Pcp Per Referring: No ref. provider found Visit Date: 05/02/2019 Occupation: @GUAROCC @  Subjective:  No chief complaint on file.   History of Present Illness: Dennis Oliver is a 58 y.o. male ***   Activities of Daily Living:  Patient reports morning stiffness for *** {minute/hour:19697}.   Patient {ACTIONS;DENIES/REPORTS:21021675::"Denies"} nocturnal pain.  Difficulty dressing/grooming: {ACTIONS;DENIES/REPORTS:21021675::"Denies"} Difficulty climbing stairs: {ACTIONS;DENIES/REPORTS:21021675::"Denies"} Difficulty getting out of chair: {ACTIONS;DENIES/REPORTS:21021675::"Denies"} Difficulty using hands for taps, buttons, cutlery, and/or writing: {ACTIONS;DENIES/REPORTS:21021675::"Denies"}  No Rheumatology ROS completed.   PMFS History:  Patient Active Problem List   Diagnosis Date Noted  . Idiopathic chronic gout of multiple sites without tophus 09/04/2016  . Primary osteoarthritis of both knees 09/04/2016  . Anal skin tag 01/08/2015  . Gout 10/29/2010  . Asthma 10/29/2010  . Allergic rhinitis 10/29/2010  . Nephrolithiasis 10/29/2010  . Hemorrhoids, internal, with bleeding 10/29/2010  . ASTHMA 03/26/2009  . Hx of adenomatous colonic polyps + FHx CRCA 10/17/1999    Past Medical History:  Diagnosis Date  . Allergy   . Anemia   . Asthma   . Blood transfusion without reported diagnosis   . Cancer ([place])    melanoma back  . DISLOCATION CLOSED INTERPHALANGEAL HAND 03/26/2009   Qualifier: Diagnosis of  By: Aline Brochure MD, Dorothyann Peng    . Diverticulosis   . H/O: gout   . Hx of adenomatous colonic polyps 10/17/1999  . Internal hemorrhoids   . Kidney stone   . Septic arthritis of knee, right ([place]) 10/29/2010  . Tubular adenoma     Family History  Problem Relation Age of Onset  . Colon cancer Mother 40  . Colon polyps Neg Hx   .  Esophageal cancer Neg Hx   . Rectal cancer Neg Hx   . Stomach cancer Neg Hx    Past Surgical History:  Procedure Laterality Date  . COLONOSCOPY    . HEMORRHOID BANDING  2016  . KIDNEY STONE SURGERY    . KNEE ARTHROSCOPY  FEB THEN 10/01/2010   right knee; HAD INFECTION REPEAT ARTHROSCOPY  . KNEE ARTHROSCOPY W/ ACL RECONSTRUCTION  20 YEARS AGO  . VASECTOMY  2016   Social History   Social History Narrative  . Not on file    There is no immunization history on file for this patient.   Objective: Vital Signs: There were no vitals taken for this visit.   Physical Exam   Musculoskeletal Exam: ***  CDAI Exam: CDAI Score: - Patient Global: -; Provider Global: - Swollen: -; Tender: - Joint Exam   No joint exam has been documented for this visit   There is currently no information documented on the homunculus. Go to the Rheumatology activity and complete the homunculus joint exam.  Investigation: No additional findings.  Imaging: No results found.  Recent Labs: Lab Results  Component Value Date   WBC 9.1 01/25/2019   HGB 16.0 01/25/2019   PLT 219 01/25/2019   NA 143 01/25/2019   K 4.8 01/25/2019   CL 108 01/25/2019   CO2 26 01/25/2019   GLUCOSE 85 01/25/2019   BUN 15 01/25/2019   CREATININE 0.96 01/25/2019   BILITOT 0.4 01/25/2019   ALKPHOS 74 09/08/2016   AST 19  01/25/2019   ALT 23 01/25/2019   PROT 7.3 01/25/2019   ALBUMIN 4.3 09/08/2016   CALCIUM 10.0 01/25/2019   GFRAA 100 01/25/2019    Speciality Comments: No specialty comments available.  Procedures:  No procedures performed Allergies: Shellfish allergy   Assessment / Plan:     Visit Diagnoses: No diagnosis found.  Orders: No orders of the defined types were placed in this encounter.  No orders of the defined types were placed in this encounter.   Face-to-face time spent with patient was *** minutes. Greater than 50% of time was spent in counseling and coordination of care.  Follow-Up  Instructions: No follow-ups on file.   Earnestine Mealing, CMA  Note - This record has been created using Editor, commissioning.  Chart creation errors have been sought, but may not always  have been located. Such creation errors do not reflect on  the standard of medical care.

## 2019-04-25 ENCOUNTER — Other Ambulatory Visit: Payer: Self-pay | Admitting: Rheumatology

## 2019-04-25 NOTE — Telephone Encounter (Signed)
Last Visit: 10/27/18 Next Visit: 05/02/19 Labs: 01/25/19 WNL  Okay to refill per Dr. Estanislado Pandy

## 2019-05-02 ENCOUNTER — Ambulatory Visit: Payer: Self-pay | Admitting: Rheumatology

## 2019-05-17 ENCOUNTER — Other Ambulatory Visit: Payer: Self-pay | Admitting: *Deleted

## 2019-05-17 DIAGNOSIS — Z20822 Contact with and (suspected) exposure to covid-19: Secondary | ICD-10-CM

## 2019-05-18 LAB — SPECIMEN STATUS REPORT

## 2019-05-18 LAB — NOVEL CORONAVIRUS, NAA: SARS-CoV-2, NAA: NOT DETECTED

## 2019-05-30 NOTE — Progress Notes (Deleted)
Office Visit Note  Patient: Dennis Oliver             Date of Birth: 1961/02/14           MRN: [id number]             PCP: Patient, No Pcp Per Referring: No ref. provider found Visit Date: 06/13/2019 Occupation: @GUAROCC @  Subjective:  No chief complaint on file.   History of Present Illness: Dennis Oliver is a 58 y.o. male ***   Activities of Daily Living:  Patient reports morning stiffness for *** {minute/hour:19697}.   Patient {ACTIONS;DENIES/REPORTS:21021675::"Denies"} nocturnal pain.  Difficulty dressing/grooming: {ACTIONS;DENIES/REPORTS:21021675::"Denies"} Difficulty climbing stairs: {ACTIONS;DENIES/REPORTS:21021675::"Denies"} Difficulty getting out of chair: {ACTIONS;DENIES/REPORTS:21021675::"Denies"} Difficulty using hands for taps, buttons, cutlery, and/or writing: {ACTIONS;DENIES/REPORTS:21021675::"Denies"}  No Rheumatology ROS completed.   PMFS History:  Patient Active Problem List   Diagnosis Date Noted  . Idiopathic chronic gout of multiple sites without tophus 09/04/2016  . Primary osteoarthritis of both knees 09/04/2016  . Anal skin tag 01/08/2015  . Gout 10/29/2010  . Asthma 10/29/2010  . Allergic rhinitis 10/29/2010  . Nephrolithiasis 10/29/2010  . Hemorrhoids, internal, with bleeding 10/29/2010  . ASTHMA 03/26/2009  . Hx of adenomatous colonic polyps + FHx CRCA 10/17/1999    Past Medical History:  Diagnosis Date  . Allergy   . Anemia   . Asthma   . Blood transfusion without reported diagnosis   . Cancer ([place])    melanoma back  . DISLOCATION CLOSED INTERPHALANGEAL HAND 03/26/2009   Qualifier: Diagnosis of  By: Aline Brochure MD, Dorothyann Peng    . Diverticulosis   . H/O: gout   . Hx of adenomatous colonic polyps 10/17/1999  . Internal hemorrhoids   . Kidney stone   . Septic arthritis of knee, right ([place]) 10/29/2010  . Tubular adenoma     Family History  Problem Relation Age of Onset  . Colon cancer Mother 28  . Colon polyps Neg Hx   .  Esophageal cancer Neg Hx   . Rectal cancer Neg Hx   . Stomach cancer Neg Hx    Past Surgical History:  Procedure Laterality Date  . COLONOSCOPY    . HEMORRHOID BANDING  2016  . KIDNEY STONE SURGERY    . KNEE ARTHROSCOPY  FEB THEN 10/01/2010   right knee; HAD INFECTION REPEAT ARTHROSCOPY  . KNEE ARTHROSCOPY W/ ACL RECONSTRUCTION  20 YEARS AGO  . VASECTOMY  2016   Social History   Social History Narrative  . Not on file    There is no immunization history on file for this patient.   Objective: Vital Signs: There were no vitals taken for this visit.   Physical Exam   Musculoskeletal Exam: ***  CDAI Exam: CDAI Score: - Patient Global: -; Provider Global: - Swollen: -; Tender: - Joint Exam   No joint exam has been documented for this visit   There is currently no information documented on the homunculus. Go to the Rheumatology activity and complete the homunculus joint exam.  Investigation: No additional findings.  Imaging: No results found.  Recent Labs: Lab Results  Component Value Date   WBC 9.1 01/25/2019   HGB 16.0 01/25/2019   PLT 219 01/25/2019   NA 143 01/25/2019   K 4.8 01/25/2019   CL 108 01/25/2019   CO2 26 01/25/2019   GLUCOSE 85 01/25/2019   BUN 15 01/25/2019   CREATININE 0.96 01/25/2019   BILITOT 0.4 01/25/2019   ALKPHOS 74 09/08/2016   AST 19  01/25/2019   ALT 23 01/25/2019   PROT 7.3 01/25/2019   ALBUMIN 4.3 09/08/2016   CALCIUM 10.0 01/25/2019   GFRAA 100 01/25/2019    Speciality Comments: No specialty comments available.  Procedures:  No procedures performed Allergies: Shellfish allergy   Assessment / Plan:     Visit Diagnoses: No diagnosis found.  Orders: No orders of the defined types were placed in this encounter.  No orders of the defined types were placed in this encounter.   Face-to-face time spent with patient was *** minutes. Greater than 50% of time was spent in counseling and coordination of care.  Follow-Up  Instructions: No follow-ups on file.   Ofilia Neas, PA-C  Note - This record has been created using Dragon software.  Chart creation errors have been sought, but may not always  have been located. Such creation errors do not reflect on  the standard of medical care.

## 2019-06-13 ENCOUNTER — Ambulatory Visit: Payer: BC Managed Care – PPO | Admitting: Physician Assistant

## 2019-08-09 ENCOUNTER — Telehealth: Payer: Self-pay | Admitting: Rheumatology

## 2019-08-09 MED ORDER — ALLOPURINOL 300 MG PO TABS: 300.0000 mg | ORAL_TABLET | Freq: Every day | ORAL | 0 refills | Status: DC

## 2019-08-09 NOTE — Telephone Encounter (Signed)
Patient left a voicemail requesting prescription refill of Allopurinol to be sent to [facility] in [place].

## 2019-08-09 NOTE — Telephone Encounter (Signed)
Last Visit: 10/27/2018 telemedicine  Next Visit: message sent to the front desk to schedule  Labs: 01/25/2019 CBC and CMP are normal. Uric acid: 01/25/2019 5.0  Okay to refill per Dr. Estanislado Pandy.

## 2019-08-25 NOTE — Progress Notes (Signed)
Office Visit Note  Patient: Dennis Oliver             Date of Birth: [birth year]           MRN: [id number]             PCP: Patient, No Pcp Per Referring: No ref. provider found Visit Date: 08/26/2019 Occupation: @GUAROCC @  Subjective:  Medication monitoring   History of Present Illness: Dennis Oliver is a 59 y.o. male with history of gout and osteoarthritis.  He is taking allopurinol 300 mg 1 tablet by mouth daily and colchicine 0.6 mg 1 tablet as needed for gout flares.  He has not had any recent gout flares. He has occasional discomfort in both feet.  He has no joint swelling.  He has chronic right knee joint pain but no inflammation. He had recent lab work.    Activities of Daily Living:  Patient reports morning stiffness for 0 minutes.   Patient Denies nocturnal pain.  Difficulty dressing/grooming: Denies Difficulty climbing stairs: Denies Difficulty getting out of chair: Denies Difficulty using hands for taps, buttons, cutlery, and/or writing: Denies  Review of Systems  Constitutional: Negative for fatigue.  HENT: Negative for mouth sores, mouth dryness and nose dryness.   Eyes: Negative for itching and dryness.  Respiratory: Negative for shortness of breath, wheezing and difficulty breathing.   Cardiovascular: Negative for chest pain and palpitations.  Gastrointestinal: Negative for blood in stool, constipation and diarrhea.  Endocrine: Negative for increased urination.  Genitourinary: Negative for difficulty urinating and painful urination.  Musculoskeletal: Negative for arthralgias, joint pain, joint swelling and morning stiffness.  Skin: Negative for rash.  Allergic/Immunologic: Negative for susceptible to infections.  Neurological: Negative for dizziness, headaches, memory loss and weakness.  Hematological: Negative for bruising/bleeding tendency.  Psychiatric/Behavioral: Negative for confusion. The patient is not nervous/anxious.     PMFS History:    Patient Active Problem List   Diagnosis Date Noted  . Idiopathic chronic gout of multiple sites without tophus 09/04/2016  . Primary osteoarthritis of both knees 09/04/2016  . Anal skin tag 01/08/2015  . Gout 10/29/2010  . Asthma 10/29/2010  . Allergic rhinitis 10/29/2010  . Nephrolithiasis 10/29/2010  . Hemorrhoids, internal, with bleeding 10/29/2010  . ASTHMA 03/26/2009  . Hx of adenomatous colonic polyps + FHx CRCA 10/17/1999    Past Medical History:  Diagnosis Date  . Allergy   . Anemia   . Asthma   . Blood transfusion without reported diagnosis   . Cancer ([place])    melanoma back  . DISLOCATION CLOSED INTERPHALANGEAL HAND 03/26/2009   Qualifier: Diagnosis of  By: Aline Brochure MD, Dorothyann Peng    . Diverticulosis   . H/O: gout   . Hx of adenomatous colonic polyps 10/17/1999  . Internal hemorrhoids   . Kidney stone   . Septic arthritis of knee, right ([place]) 10/29/2010  . Tubular adenoma     Family History  Problem Relation Age of Onset  . Colon cancer Mother 69  . Bladder Cancer Father   . Heart attack Father   . Healthy Brother   . Healthy Brother   . Healthy Son   . Colon polyps Neg Hx   . Esophageal cancer Neg Hx   . Rectal cancer Neg Hx   . Stomach cancer Neg Hx    Past Surgical History:  Procedure Laterality Date  . COLONOSCOPY    . HEMORRHOID BANDING  2016  . KIDNEY STONE SURGERY    . KNEE  ARTHROSCOPY  FEB THEN 10/01/2010   right knee; HAD INFECTION REPEAT ARTHROSCOPY  . KNEE ARTHROSCOPY W/ ACL RECONSTRUCTION  20 YEARS AGO  . VASECTOMY  2016   Social History   Social History Narrative  . Not on file    There is no immunization history on file for this patient.   Objective: Vital Signs: BP 126/82 (BP Location: Left Arm, Patient Position: Sitting, Cuff Size: Normal)   Pulse 72   Resp 15   Ht 5\' 10"  (1.778 m)   Wt 250 lb (113.4 kg)   BMI 35.87 kg/m    Physical Exam Vitals and nursing note reviewed.  Constitutional:      Appearance: He is  well-developed.  HENT:     Head: Normocephalic and atraumatic.  Eyes:     Conjunctiva/sclera: Conjunctivae normal.     Pupils: Pupils are equal, round, and reactive to light.  Cardiovascular:     Rate and Rhythm: Normal rate and regular rhythm.     Heart sounds: Normal heart sounds.  Pulmonary:     Effort: Pulmonary effort is normal.     Breath sounds: Normal breath sounds.  Abdominal:     General: Bowel sounds are normal.     Palpations: Abdomen is soft.  Musculoskeletal:     Cervical back: Normal range of motion and neck supple.  Skin:    General: Skin is warm and dry.     Capillary Refill: Capillary refill takes less than 2 seconds.  Neurological:     Mental Status: He is alert and oriented to person, place, and time.  Psychiatric:        Behavior: Behavior normal.      Musculoskeletal Exam: C-spine, thoracic spine, and lumbar spine good ROM.  No midline spinal tenderness.  Shoulder joints, elbow joints, wrist joints, MCPs, PIPs, and DIPs good ROM with no synovitis.  Hip joints, knee joints, ankle joints, MTPs, PIPs, and DIPs good ROM with no synovitis.  No warmth or effusion of knee joints.  No tenderness or swelling of ankle joints.  Bunions noted bilaterally.   CDAI Exam: CDAI Score: -- Patient Global: --; Provider Global: -- Swollen: --; Tender: -- Joint Exam 08/26/2019   No joint exam has been documented for this visit   There is currently no information documented on the homunculus. Go to the Rheumatology activity and complete the homunculus joint exam.  Investigation: No additional findings.  Imaging: No results found.  Recent Labs: Lab Results  Component Value Date   WBC 9.1 01/25/2019   HGB 16.0 01/25/2019   PLT 219 01/25/2019   NA 143 01/25/2019   K 4.8 01/25/2019   CL 108 01/25/2019   CO2 26 01/25/2019   GLUCOSE 85 01/25/2019   BUN 15 01/25/2019   CREATININE 0.96 01/25/2019   BILITOT 0.4 01/25/2019   ALKPHOS 74 09/08/2016   AST 19 01/25/2019    ALT 23 01/25/2019   PROT 7.3 01/25/2019   ALBUMIN 4.3 09/08/2016   CALCIUM 10.0 01/25/2019   GFRAA 100 01/25/2019    Speciality Comments: No specialty comments available.  Procedures:  No procedures performed Allergies: Shellfish allergy     Assessment / Plan:     Visit Diagnoses: Idiopathic chronic gout of multiple sites without tophus: He has not had any recent gout flares. He is clinically doing well on allopurinol 300 mg 1 tablet by mouth daily and colchicine 0.6 mg 1 tablet by mouth as needed for gout flares.  No tophi noted. He has  no joint tenderness or inflammation on exam.  His uric acid was 5.2 in 07/2019. He will continue taking allopurinol as prescribed. A refill of allopurinol was sent to the pharmacy. He was advised to notify us if he develops signs or symptoms of a gout flare.  He will follow up in 6 months.   Medication monitoring encounter - Allopurinol 300 mg 1 tablet by mouth daily.  He takes colchicine 0.6 mg po PRN for gout flares.  07/2019: LDL 210, HgbA1c 5.6, Calcium 10.5, rest of CMP WNL.  CBC WNL, uric acid 5.2.  Primary osteoarthritis of both knees: He has chronic right knee joint pain.  He has good ROM of both knee joints.  No warmth or effusion of knee joints noted.    Other medical conditions are listed as follows:   History of asthma  History of melanoma  History of renal calculi  Orders: No orders of the defined types were placed in this encounter.  Meds ordered this encounter  Medications  . allopurinol (ZYLOPRIM) 300 MG tablet    Sig: Take 1 tablet (300 mg total) by mouth daily.    Dispense:  90 tablet    Refill:  0      Follow-Up Instructions: Return in about 6 months (around 02/23/2020) for Gout, Osteoarthritis.   Ofilia Neas, PA-C   I examined and evaluated the patient with Hazel Sams PA.  Patient has been doing well without any gout flares.  He had only osteoarthritic changes on examination.  No tophi were noted.  We will  continue current dosage of allopurinol.  He had recent labs with his lipidologist.  He is making some dietary changes.  The plan of care was discussed as noted above.  Bo Merino, MD  Note - This record has been created using Editor, commissioning.  Chart creation errors have been sought, but may not always  have been located. Such creation errors do not reflect on  the standard of medical care.

## 2019-08-26 ENCOUNTER — Encounter: Payer: Self-pay | Admitting: Rheumatology

## 2019-08-26 ENCOUNTER — Other Ambulatory Visit: Payer: Self-pay

## 2019-08-26 ENCOUNTER — Ambulatory Visit: Payer: BC Managed Care – PPO | Admitting: Rheumatology

## 2019-08-26 VITALS — BP 126/82 | HR 72 | Resp 15 | Ht 70.0 in | Wt 250.0 lb

## 2019-08-26 DIAGNOSIS — Z5181 Encounter for therapeutic drug level monitoring: Secondary | ICD-10-CM | POA: Diagnosis not present

## 2019-08-26 DIAGNOSIS — Z8709 Personal history of other diseases of the respiratory system: Secondary | ICD-10-CM

## 2019-08-26 DIAGNOSIS — M170 Bilateral primary osteoarthritis of knee: Secondary | ICD-10-CM | POA: Diagnosis not present

## 2019-08-26 DIAGNOSIS — Z87442 Personal history of urinary calculi: Secondary | ICD-10-CM

## 2019-08-26 DIAGNOSIS — M1A09X0 Idiopathic chronic gout, multiple sites, without tophus (tophi): Secondary | ICD-10-CM | POA: Diagnosis not present

## 2019-08-26 DIAGNOSIS — Z85820 Personal history of malignant melanoma of skin: Secondary | ICD-10-CM

## 2019-08-26 MED ORDER — ALLOPURINOL 300 MG PO TABS: 300.0000 mg | ORAL_TABLET | Freq: Every day | ORAL | 0 refills | Status: DC

## 2019-09-29 ENCOUNTER — Ambulatory Visit: Payer: BC Managed Care – PPO | Attending: Internal Medicine

## 2019-09-29 DIAGNOSIS — Z23 Encounter for immunization: Secondary | ICD-10-CM

## 2019-09-29 NOTE — Progress Notes (Signed)
   Covid-19 Vaccination Clinic  Name:  Dennis Oliver    MRN: [id number] DOB: 1961-02-12  09/29/2019  Dennis Oliver was observed post Covid-19 immunization for 15 minutes without incident. He was provided with Vaccine Information Sheet and instruction to access the V-Safe system.   Dennis Oliver was instructed to call 911 with any severe reactions post vaccine: Marland Kitchen Difficulty breathing  . Swelling of face and throat  . A fast heartbeat  . A bad rash all over body  . Dizziness and weakness   Immunizations Administered    Name Date Dose VIS Date Route   Moderna COVID-19 Vaccine 09/29/2019  8:32 AM 0.5 mL 06/28/2019 Intramuscular   Manufacturer: Moderna   Lot: [id number]   [place]:[id number]

## 2019-11-01 ENCOUNTER — Ambulatory Visit: Payer: BC Managed Care – PPO | Attending: Internal Medicine

## 2019-11-01 DIAGNOSIS — Z23 Encounter for immunization: Secondary | ICD-10-CM

## 2019-11-01 NOTE — Progress Notes (Signed)
   Covid-19 Vaccination Clinic  Name:  Dnaiel Dehring    MRN: [id number] DOB: 1961-02-08  11/01/2019  Mr. Braband was observed post Covid-19 immunization for 15 minutes without incident. He was provided with Vaccine Information Sheet and instruction to access the V-Safe system.    Mr. Salton was instructed to call 911 with any severe reactions post vaccine: Marland Kitchen Difficulty breathing  . Swelling of face and throat  . A fast heartbeat  . A bad rash all over body  . Dizziness and weakness   Immunizations Administered    Name Date Dose VIS Date Route   Moderna COVID-19 Vaccine 11/01/2019  8:23 AM 0.5 mL 06/28/2019 Intramuscular   Manufacturer: Moderna   LotMV:[id number]   [place]:[id number]

## 2020-01-11 DIAGNOSIS — E8881 Metabolic syndrome: Secondary | ICD-10-CM | POA: Insufficient documentation

## 2020-01-11 DIAGNOSIS — E291 Testicular hypofunction: Secondary | ICD-10-CM | POA: Insufficient documentation

## 2020-01-11 DIAGNOSIS — N5201 Erectile dysfunction due to arterial insufficiency: Secondary | ICD-10-CM | POA: Insufficient documentation

## 2020-01-11 DIAGNOSIS — Z87442 Personal history of urinary calculi: Secondary | ICD-10-CM | POA: Insufficient documentation

## 2020-01-11 DIAGNOSIS — F410 Panic disorder [episodic paroxysmal anxiety] without agoraphobia: Secondary | ICD-10-CM | POA: Insufficient documentation

## 2020-01-11 DIAGNOSIS — Z85820 Personal history of malignant melanoma of skin: Secondary | ICD-10-CM | POA: Insufficient documentation

## 2020-01-11 DIAGNOSIS — E785 Hyperlipidemia, unspecified: Secondary | ICD-10-CM | POA: Insufficient documentation

## 2020-02-12 ENCOUNTER — Other Ambulatory Visit: Payer: Self-pay | Admitting: Physician Assistant

## 2020-02-13 NOTE — Telephone Encounter (Signed)
Please schedule patient for a follow up visit. Patient due July 2021. Thanks!

## 2020-02-13 NOTE — Telephone Encounter (Signed)
LMOM for patient to call and schedule follow-up appointment.   °

## 2020-02-13 NOTE — Telephone Encounter (Signed)
Last Visit: 08/26/2019 Next Visit: due July 2021. Message sent to the front to schedule patient  Labs: 07/2019 LDL 210, HgbA1c 5.6, Calcium 10.5, rest of CMP WNL.  CBC WNL, uric acid 5.2.  Current Dose per office note 08/26/2019: Allopurinol 300 mg 1 tablet by mouth daily DX:  Idiopathic chronic gout of multiple sites without tophus  Okay to refill Allopurinol?

## 2020-02-20 ENCOUNTER — Other Ambulatory Visit: Payer: Self-pay | Admitting: *Deleted

## 2020-02-20 DIAGNOSIS — M1A09X0 Idiopathic chronic gout, multiple sites, without tophus (tophi): Secondary | ICD-10-CM

## 2020-02-20 DIAGNOSIS — Z5181 Encounter for therapeutic drug level monitoring: Secondary | ICD-10-CM

## 2020-02-21 ENCOUNTER — Encounter: Payer: Self-pay | Admitting: Rheumatology

## 2020-02-21 LAB — COMPLETE METABOLIC PANEL WITH GFR
AG Ratio: 1.5 (calc) (ref 1.0–2.5)
ALT: 42 U/L (ref 9–46)
AST: 26 U/L (ref 10–35)
Albumin: 4.4 g/dL (ref 3.6–5.1)
Alkaline phosphatase (APISO): 77 U/L (ref 35–144)
BUN: 12 mg/dL (ref 7–25)
CO2: 25 mmol/L (ref 20–32)
Calcium: 9.8 mg/dL (ref 8.6–10.3)
Chloride: 108 mmol/L (ref 98–110)
Creat: 0.98 mg/dL (ref 0.70–1.25)
GFR, Est African American: 97 mL/min/{1.73_m2} (ref >=60)
GFR, Est Non African American: 83 mL/min/{1.73_m2} (ref >=60)
Globulin: 2.9 g/dL (calc) (ref 1.9–3.7)
Glucose, Bld: 95 mg/dL (ref 65–99)
Potassium: 4.6 mmol/L (ref 3.5–5.3)
Sodium: 141 mmol/L (ref 135–146)
Total Bilirubin: 0.4 mg/dL (ref 0.2–1.2)
Total Protein: 7.3 g/dL (ref 6.1–8.1)

## 2020-02-21 LAB — CBC WITH DIFFERENTIAL/PLATELET
Absolute Monocytes: 688 cells/uL (ref 200–950)
Basophils Absolute: 67 cells/uL (ref 0–200)
Basophils Relative: 0.9 %
Eosinophils Absolute: 192 cells/uL (ref 15–500)
Eosinophils Relative: 2.6 %
HCT: 45.6 % (ref 38.5–50.0)
Hemoglobin: 15.8 g/dL (ref 13.2–17.1)
Lymphs Abs: 2176 cells/uL (ref 850–3900)
MCH: 31.7 pg (ref 27.0–33.0)
MCHC: 34.6 g/dL (ref 32.0–36.0)
MCV: 91.6 fL (ref 80.0–100.0)
MPV: 11.3 fL (ref 7.5–12.5)
Monocytes Relative: 9.3 %
Neutro Abs: 4277 cells/uL (ref 1500–7800)
Neutrophils Relative %: 57.8 %
Platelets: 221 10*3/uL (ref 140–400)
RBC: 4.98 10*6/uL (ref 4.20–5.80)
RDW: 13.2 % (ref 11.0–15.0)
Total Lymphocyte: 29.4 %
WBC: 7.4 10*3/uL (ref 3.8–10.8)

## 2020-02-21 LAB — URIC ACID: Uric Acid, Serum: 4.9 mg/dL (ref 4.0–8.0)

## 2020-03-06 ENCOUNTER — Other Ambulatory Visit: Payer: Self-pay | Admitting: Physician Assistant

## 2020-03-06 NOTE — Telephone Encounter (Signed)
Called patient to schedule 6 month follow-up appointment.  Patient states he had labwork on 02/20/20.  Patient states he sees Dr. Estanislado Pandy once a year not every 6 months.  Patient states he is not due until January 2022.  Patient states he will call back to schedule the appointment.

## 2020-03-06 NOTE — Telephone Encounter (Signed)
Okay to reschedule once a year.

## 2020-03-06 NOTE — Telephone Encounter (Signed)
Last Visit: 08/26/2019 Next Visit: due July 2021. Message sent to the front to schedule patient  Labs: 02/20/2020 CBC and CMP WNL. Uric acid is within the desirable range.   Current Dose per office note 08/26/2019: Allopurinol 300 mg1 tablet by mouthdaily DX: Idiopathic chronic gout of multiple sites without tophus  Okay to refill Allopurinol?

## 2020-03-06 NOTE — Telephone Encounter (Signed)
Please schedule patient for a follow up visit. Patient was due July 2021. Thanks!  

## 2020-04-01 ENCOUNTER — Other Ambulatory Visit: Payer: Self-pay | Admitting: Rheumatology

## 2020-04-03 NOTE — Telephone Encounter (Signed)
Please schedule patient for a follow up visit. Patient due January 2022. Thanks!  

## 2020-04-03 NOTE — Telephone Encounter (Signed)
I spoke with patient in regards to scheduling a follow up appointment for January 2022. Patient will call back at a later date to schedule.

## 2020-04-03 NOTE — Telephone Encounter (Signed)
Last Visit:08/26/2019 Next Visit:due January 2022. Message sent to the front to schedule  Labs: 02/20/2020 CBC and CMP WNL. Uric acid is within the desirable range.   Current Dose per office note1/29/2021:Allopurinol 300 mg1 tablet by mouthdaily HQ:[id number] chronic gout of multiple sites without tophus  Okay to refill per Dr. Estanislado Pandy

## 2020-06-15 ENCOUNTER — Other Ambulatory Visit: Payer: Self-pay | Admitting: Rheumatology

## 2020-06-15 NOTE — Telephone Encounter (Signed)
Contacted patient and scheduled patient for a follow up visit in January 2022. Patient advised he is due for labs in December 2021. Patient expressed understanding.

## 2020-06-15 NOTE — Telephone Encounter (Signed)
His labs are due in December.  Follow-up appointment is once a year which should be in January.

## 2020-07-18 ENCOUNTER — Other Ambulatory Visit: Payer: Self-pay | Admitting: Rheumatology

## 2020-07-18 DIAGNOSIS — Z5181 Encounter for therapeutic drug level monitoring: Secondary | ICD-10-CM

## 2020-07-18 DIAGNOSIS — M1A09X0 Idiopathic chronic gout, multiple sites, without tophus (tophi): Secondary | ICD-10-CM

## 2020-07-18 NOTE — Telephone Encounter (Signed)
Future Orders placed. Patient advised he is due for labs.

## 2020-07-18 NOTE — Addendum Note (Signed)
Addended by: Carole Binning on: 07/18/2020 10:28 AM   Modules accepted: Orders

## 2020-07-18 NOTE — Telephone Encounter (Signed)
Labs are due.  Please asked patient to get labs which should include CBC, CMP and uric acid

## 2020-08-11 ENCOUNTER — Other Ambulatory Visit: Payer: Self-pay | Admitting: Rheumatology

## 2020-08-11 NOTE — Telephone Encounter (Signed)
Patient needs labs and appointment. I will send one month supply. Please schedule appointment.

## 2020-08-13 NOTE — Telephone Encounter (Signed)
Patient has appt 08/27/2020 with Hazel Sams,        Carole Binning, LPN      14/38/88 75:79 AM Note Future Orders placed. Patient advised he is due for labs.

## 2020-08-14 NOTE — Progress Notes (Signed)
Office Visit Note  Patient: Dennis Oliver             Date of Birth: Jun 02, 1961           MRN: [id number]             PCP: Patient, No Pcp Per Referring: No ref. provider found Visit Date: 08/27/2020 Occupation: @GUAROCC @  Subjective:  Right knee joint pain  History of Present Illness: Dennis Oliver is a 60 y.o. male story of gout and osteoarthritis.  Patient is taking allopurinol 300 mg 1 tablet by mouth daily.  He has not needed to take colchicine since his last office visit.  He denies any recent gout flares.  He states he has been trying to avoid red meat, shellfish, and beer.  He continues to have chronic pain in his right knee joint.  He has an appointment with Dr. Wynelle Link tomorrow to proceed with another series of Visco gel injections.  He is not ready for knee replacement at this time.  He states his discomfort is most severe after strenuous activities.  Denies any other joint pain or joint swelling at this time.  He denies any new medical conditions.  He denies any other changes in medications.  Activities of Daily Living:  Patient reports morning stiffness for 0 minutes.   Patient Reports nocturnal pain.  Difficulty dressing/grooming: Denies Difficulty climbing stairs: Reports Difficulty getting out of chair: Denies Difficulty using hands for taps, buttons, cutlery, and/or writing: Denies  Review of Systems  Constitutional: Negative for fatigue.  HENT: Negative for mouth sores, mouth dryness and nose dryness.   Eyes: Negative for pain, itching and dryness.  Respiratory: Negative for shortness of breath and difficulty breathing.   Cardiovascular: Negative for chest pain and palpitations.  Gastrointestinal: Negative for blood in stool, constipation and diarrhea.  Endocrine: Negative for increased urination.  Genitourinary: Negative for difficulty urinating.  Musculoskeletal: Negative for arthralgias, joint pain, joint swelling, myalgias, morning stiffness, muscle  tenderness and myalgias.  Skin: Negative for color change, rash and redness.  Allergic/Immunologic: Negative for susceptible to infections.  Neurological: Negative for dizziness, numbness, headaches, memory loss and weakness.  Hematological: Negative for bruising/bleeding tendency.  Psychiatric/Behavioral: Negative for confusion.    PMFS History:  Patient Active Problem List   Diagnosis Date Noted  . Idiopathic chronic gout of multiple sites without tophus 09/04/2016  . Primary osteoarthritis of both knees 09/04/2016  . Anal skin tag 01/08/2015  . Gout 10/29/2010  . Asthma 10/29/2010  . Allergic rhinitis 10/29/2010  . Nephrolithiasis 10/29/2010  . Hemorrhoids, internal, with bleeding 10/29/2010  . ASTHMA 03/26/2009  . Hx of adenomatous colonic polyps + FHx CRCA 10/17/1999    Past Medical History:  Diagnosis Date  . Allergy   . Anemia   . Asthma   . Blood transfusion without reported diagnosis   . Cancer ([place])    melanoma back  . DISLOCATION CLOSED INTERPHALANGEAL HAND 03/26/2009   Qualifier: Diagnosis of  By: Aline Brochure MD, Dorothyann Peng    . Diverticulosis   . H/O: gout   . Hx of adenomatous colonic polyps 10/17/1999  . Internal hemorrhoids   . Kidney stone   . Septic arthritis of knee, right ([place]) 10/29/2010  . Tubular adenoma     Family History  Problem Relation Age of Onset  . Colon cancer Mother 63  . Bladder Cancer Father   . Heart attack Father   . Healthy Brother   . Healthy Brother   .  Healthy Son   . Colon polyps Neg Hx   . Esophageal cancer Neg Hx   . Rectal cancer Neg Hx   . Stomach cancer Neg Hx    Past Surgical History:  Procedure Laterality Date  . COLONOSCOPY    . HEMORRHOID BANDING  2016  . KIDNEY STONE SURGERY    . KNEE ARTHROSCOPY  FEB THEN 10/01/2010   right knee; HAD INFECTION REPEAT ARTHROSCOPY  . KNEE ARTHROSCOPY W/ ACL RECONSTRUCTION  20 YEARS AGO  . VASECTOMY  2016   Social History   Social History Narrative  . Not on file    Immunization History  Administered Date(s) Administered  . Moderna Sars-Covid-2 Vaccination 09/29/2019, 11/01/2019, 07/13/2020     Objective: Vital Signs: BP 127/86 (BP Location: Left Arm, Patient Position: Sitting, Cuff Size: Large)   Pulse 62   Resp 17   Ht 5\' 10"  (1.778 m)   Wt 250 lb 3.2 oz (113.5 kg)   BMI 35.90 kg/m    Physical Exam Vitals and nursing note reviewed.  Constitutional:      Appearance: He is well-developed and well-nourished.  HENT:     Head: Normocephalic and atraumatic.  Eyes:     Extraocular Movements: EOM normal.     Conjunctiva/sclera: Conjunctivae normal.     Pupils: Pupils are equal, round, and reactive to light.  Pulmonary:     Effort: Pulmonary effort is normal.  Abdominal:     Palpations: Abdomen is soft.  Musculoskeletal:     Cervical back: Normal range of motion and neck supple.  Skin:    General: Skin is warm and dry.     Capillary Refill: Capillary refill takes less than 2 seconds.  Neurological:     Mental Status: He is alert and oriented to person, place, and time.  Psychiatric:        Mood and Affect: Mood and affect normal.        Behavior: Behavior normal.      Musculoskeletal Exam: C-spine, thoracic spine, and lumbar spine good ROM with no discomfort.  Shoulder joints, elbow joints, wrist joints, MCPs, PIPs, and DIPs good ROM with no discomfort.   PIP and DIP thickening consistent with osteoarthritis of both hands.  Complete fist formation bilaterally.  Hip joints good ROM with no discomfort.  Painful flexion of the right knee. Warmth of the right knee joint noted.  Left knee has good ROM with no discomfort.  Ankle joints good ROM with no tenderness or inflammation.   CDAI Exam: CDAI Score: -- Patient Global: --; Provider Global: -- Swollen: --; Tender: -- Joint Exam 08/27/2020   No joint exam has been documented for this visit   There is currently no information documented on the homunculus. Go to the Rheumatology  activity and complete the homunculus joint exam.  Investigation: No additional findings.  Imaging: No results found.  Recent Labs: Lab Results  Component Value Date   WBC 7.4 02/20/2020   HGB 15.8 02/20/2020   PLT 221 02/20/2020   NA 141 02/20/2020   K 4.6 02/20/2020   CL 108 02/20/2020   CO2 25 02/20/2020   GLUCOSE 95 02/20/2020   BUN 12 02/20/2020   CREATININE 0.98 02/20/2020   BILITOT 0.4 02/20/2020   ALKPHOS 74 09/08/2016   AST 26 02/20/2020   ALT 42 02/20/2020   PROT 7.3 02/20/2020   ALBUMIN 4.3 09/08/2016   CALCIUM 9.8 02/20/2020   GFRAA 97 02/20/2020    Speciality Comments: No specialty comments available.  Procedures:  No procedures performed Allergies: Shellfish allergy   Assessment / Plan:     Visit Diagnoses: Idiopathic chronic gout of multiple sites without tophus -He has not had any recent gout flares.  He is clinically doing well taking allopurinol 300 mg 1 tablet by mouth daily.  He is tolerating allopurinol without any side effects.  He does not recall the last time he had to take colchicine.  He denies any recent gout flares.  We discussed the importance of avoiding a purine rich diet as well as foods with high fructose corn syrup.  His uric acid level was 4.9 on 01/31/2020.  We discussed that ideally his uric acid level should remain less than 6.  We will check his uric acid level, CBC, and CMP today.  He will continue taking allopurinol as prescribed.  Refill of allopurinol was sent to the pharmacy.  He was advised to notify us if he develops signs or symptoms of a gout flare.  He will follow-up in the office in 6 months.  - Plan: CBC with Differential/Platelet, COMPLETE METABOLIC PANEL WITH GFR, Uric acid  Medication monitoring encounter - Allopurinol 300 mg 1 tablet by mouth daily.  He takes colchicine 0.6 mg po PRN for gout flares.  CBC and CMP were updated today to monitor for drug toxicity.  Uric acid level will also be checked today.- Plan: CBC with  Differential/Platelet, COMPLETE METABOLIC PANEL WITH GFR, Uric acid  Primary osteoarthritis of both knees: He follows up with Dr. Wynelle Link on a regular basis. He continues to have chronic pain in his right knee joint.   On examination today he has painful flexion and warmth of the right knee joint.  He has an appointment tomorrow to discuss repeating visco gel injection series.  He had a right knee joint cortisone injection in November 2021.  He is not ready to proceed with a knee replacement at this time.   His left knee joint has good range of motion with no warmth or effusion.   Other medical conditions are listed as follows:  History of asthma  History of renal calculi  History of melanoma: He sees his dermatologist on a regular basis.  He has not had any recurrence of melanoma recently.  Orders: Orders Placed This Encounter  Procedures  . CBC with Differential/Platelet  . COMPLETE METABOLIC PANEL WITH GFR  . Uric acid   Meds ordered this encounter  Medications  . allopurinol (ZYLOPRIM) 300 MG tablet    Sig: Take 1 tablet (300 mg total) by mouth daily.    Dispense:  90 tablet    Refill:  0      Follow-Up Instructions: Return in about 6 months (around 02/24/2021) for Gout.   Ofilia Neas, PA-C  Note - This record has been created using Dragon software.  Chart creation errors have been sought, but may not always  have been located. Such creation errors do not reflect on  the standard of medical care.

## 2020-08-27 ENCOUNTER — Encounter: Payer: Self-pay | Admitting: Physician Assistant

## 2020-08-27 ENCOUNTER — Other Ambulatory Visit: Payer: Self-pay

## 2020-08-27 ENCOUNTER — Ambulatory Visit: Payer: BC Managed Care – PPO | Admitting: Physician Assistant

## 2020-08-27 VITALS — BP 127/86 | HR 62 | Resp 17 | Ht 70.0 in | Wt 250.2 lb

## 2020-08-27 DIAGNOSIS — Z5181 Encounter for therapeutic drug level monitoring: Secondary | ICD-10-CM

## 2020-08-27 DIAGNOSIS — Z8709 Personal history of other diseases of the respiratory system: Secondary | ICD-10-CM

## 2020-08-27 DIAGNOSIS — Z87442 Personal history of urinary calculi: Secondary | ICD-10-CM

## 2020-08-27 DIAGNOSIS — M1A09X0 Idiopathic chronic gout, multiple sites, without tophus (tophi): Secondary | ICD-10-CM

## 2020-08-27 DIAGNOSIS — Z85820 Personal history of malignant melanoma of skin: Secondary | ICD-10-CM

## 2020-08-27 DIAGNOSIS — M170 Bilateral primary osteoarthritis of knee: Secondary | ICD-10-CM

## 2020-08-27 MED ORDER — ALLOPURINOL 300 MG PO TABS: 300.0000 mg | ORAL_TABLET | Freq: Every day | ORAL | 0 refills | Status: DC

## 2020-08-28 LAB — COMPLETE METABOLIC PANEL WITH GFR
AG Ratio: 1.6 (calc) (ref 1.0–2.5)
ALT: 33 U/L (ref 9–46)
AST: 25 U/L (ref 10–35)
Albumin: 4.5 g/dL (ref 3.6–5.1)
Alkaline phosphatase (APISO): 74 U/L (ref 35–144)
BUN: 16 mg/dL (ref 7–25)
CO2: 28 mmol/L (ref 20–32)
Calcium: 10 mg/dL (ref 8.6–10.3)
Chloride: 106 mmol/L (ref 98–110)
Creat: 1 mg/dL (ref 0.70–1.25)
GFR, Est African American: 94 mL/min/{1.73_m2} (ref >=60)
GFR, Est Non African American: 81 mL/min/{1.73_m2} (ref >=60)
Globulin: 2.8 g/dL (calc) (ref 1.9–3.7)
Glucose, Bld: 97 mg/dL (ref 65–99)
Potassium: 4.8 mmol/L (ref 3.5–5.3)
Sodium: 142 mmol/L (ref 135–146)
Total Bilirubin: 0.4 mg/dL (ref 0.2–1.2)
Total Protein: 7.3 g/dL (ref 6.1–8.1)

## 2020-08-28 LAB — CBC WITH DIFFERENTIAL/PLATELET
Absolute Monocytes: 696 cells/uL (ref 200–950)
Basophils Absolute: 104 cells/uL (ref 0–200)
Basophils Relative: 1.2 %
Eosinophils Absolute: 261 cells/uL (ref 15–500)
Eosinophils Relative: 3 %
HCT: 45.8 % (ref 38.5–50.0)
Hemoglobin: 15.6 g/dL (ref 13.2–17.1)
Lymphs Abs: 2436 cells/uL (ref 850–3900)
MCH: 30.7 pg (ref 27.0–33.0)
MCHC: 34.1 g/dL (ref 32.0–36.0)
MCV: 90.2 fL (ref 80.0–100.0)
MPV: 11.2 fL (ref 7.5–12.5)
Monocytes Relative: 8 %
Neutro Abs: 5203 cells/uL (ref 1500–7800)
Neutrophils Relative %: 59.8 %
Platelets: 239 10*3/uL (ref 140–400)
RBC: 5.08 10*6/uL (ref 4.20–5.80)
RDW: 13.1 % (ref 11.0–15.0)
Total Lymphocyte: 28 %
WBC: 8.7 10*3/uL (ref 3.8–10.8)

## 2020-08-28 LAB — URIC ACID: Uric Acid, Serum: 5.1 mg/dL (ref 4.0–8.0)

## 2020-08-28 NOTE — Progress Notes (Signed)
CBC and CMP WNL.  Uric acid is WNL. Continue on current dose of allopurinol.

## 2020-09-16 ENCOUNTER — Other Ambulatory Visit: Payer: Self-pay | Admitting: Rheumatology

## 2020-11-07 ENCOUNTER — Telehealth: Payer: Self-pay

## 2020-11-07 NOTE — Telephone Encounter (Signed)
Referral notes sent from [facility], [place], Phone #: [phone], Fax #: [phone]   Notes sent to scheduling

## 2020-11-15 ENCOUNTER — Ambulatory Visit (INDEPENDENT_AMBULATORY_CARE_PROVIDER_SITE_OTHER): Payer: BC Managed Care – PPO

## 2020-11-15 ENCOUNTER — Other Ambulatory Visit: Payer: Self-pay | Admitting: *Deleted

## 2020-11-15 DIAGNOSIS — R42 Dizziness and giddiness: Secondary | ICD-10-CM

## 2020-11-26 DIAGNOSIS — R42 Dizziness and giddiness: Secondary | ICD-10-CM

## 2020-12-03 ENCOUNTER — Telehealth: Payer: Self-pay | Admitting: Physician Assistant

## 2020-12-03 NOTE — Telephone Encounter (Signed)
Please call patient to schedule appt, Return in about 6 months (around 02/24/2021) for Gout.  Thank you.

## 2020-12-03 NOTE — Telephone Encounter (Signed)
Next Visit: message sent to front desk to schedule appt, Return in about 6 months (around 02/24/2021) for Gout.    Last Visit: 08/27/2020  Last Fill: 08/27/2020  DX:    Idiopathic chronic gout of multiple sites without toph Current Dose per office note 08/27/2020, Allopurinol 300 mg 1 tablet by mouth daily.   Labs: 08/27/2020, CBC and CMP WNL. Uric acid is WNL. Continue on current dose of allopurinol  Okay to refill allopurinol?

## 2020-12-04 NOTE — Telephone Encounter (Signed)
I contacted patient in regards to scheduling a follow up appointment in July. Per patient, he usually schedules once a year. Patient saw Lovena Le in January. Patient states he is up to date on labs, and having no issues. Please advise if patient needs a follow up in July, or if he is okay to schedule in January 2023.

## 2020-12-04 NOTE — Telephone Encounter (Signed)
Ok to schedule follow up in 1 year, but I do recommend continuing to have lab work drawn every 6 months while on allopurinol.  He can have CBC, CMP, and uric acid drawn at our office or his PCPs in July 2022.

## 2020-12-05 NOTE — Telephone Encounter (Signed)
Patient states he will call back in the fall to schedule his 1 year follow-up appointment.

## 2021-03-30 ENCOUNTER — Other Ambulatory Visit: Payer: Self-pay | Admitting: Rheumatology

## 2021-04-02 ENCOUNTER — Encounter: Payer: Self-pay | Admitting: Internal Medicine

## 2021-04-03 ENCOUNTER — Telehealth: Payer: Self-pay

## 2021-04-03 NOTE — Telephone Encounter (Signed)
Attempted to contact patient and rang then busy signal. Unable to leave a message.  Patient's prescription was denied because he overdue for labs and needs to schedule his follow up visit.

## 2021-04-03 NOTE — Telephone Encounter (Signed)
Patient called requesting prescription refill of Allopurinol to be sent to [facility] in [place].  Patient states he received a text from [facility] that his prescription refill request was denied.

## 2021-04-04 ENCOUNTER — Telehealth: Payer: Self-pay

## 2021-04-04 NOTE — Telephone Encounter (Signed)
Left message to advise patient prescription was denied because he overdue for labs and needs to schedule his follow up visit.

## 2021-04-04 NOTE — Telephone Encounter (Signed)
Patient can follow up in one year per Lovena Le but he needs to have that follow up appointment scheduled. At this time he has not follow up scheduled. Lab Orders are in for when patient comes to the office.

## 2021-04-04 NOTE — Telephone Encounter (Signed)
Patient called stating he was returning Andrea's call regarding labwork and follow-up appointment.  Patient states he will come in the office on Monday, 04/08/21 to have labwork.  Patient states at his appointment in January with Lovena Le he was told he could follow-up in a year which is documented on refill request note on 12/03/20.

## 2021-04-10 ENCOUNTER — Other Ambulatory Visit: Payer: Self-pay | Admitting: *Deleted

## 2021-04-10 DIAGNOSIS — M1A09X0 Idiopathic chronic gout, multiple sites, without tophus (tophi): Secondary | ICD-10-CM

## 2021-04-10 DIAGNOSIS — Z5181 Encounter for therapeutic drug level monitoring: Secondary | ICD-10-CM

## 2021-04-11 ENCOUNTER — Other Ambulatory Visit: Payer: Self-pay | Admitting: Rheumatology

## 2021-04-11 LAB — COMPLETE METABOLIC PANEL WITH GFR
AG Ratio: 1.6 (calc) (ref 1.0–2.5)
ALT: 25 U/L (ref 9–46)
AST: 22 U/L (ref 10–35)
Albumin: 4.5 g/dL (ref 3.6–5.1)
Alkaline phosphatase (APISO): 78 U/L (ref 35–144)
BUN: 13 mg/dL (ref 7–25)
CO2: 28 mmol/L (ref 20–32)
Calcium: 10 mg/dL (ref 8.6–10.3)
Chloride: 107 mmol/L (ref 98–110)
Creat: 0.97 mg/dL (ref 0.70–1.35)
Globulin: 2.9 g/dL (calc) (ref 1.9–3.7)
Glucose, Bld: 97 mg/dL (ref 65–99)
Potassium: 4.9 mmol/L (ref 3.5–5.3)
Sodium: 144 mmol/L (ref 135–146)
Total Bilirubin: 0.4 mg/dL (ref 0.2–1.2)
Total Protein: 7.4 g/dL (ref 6.1–8.1)
eGFR: 89 mL/min/{1.73_m2} (ref >=60)

## 2021-04-11 LAB — CBC WITH DIFFERENTIAL/PLATELET
Absolute Monocytes: 616 cells/uL (ref 200–950)
Basophils Absolute: 81 cells/uL (ref 0–200)
Basophils Relative: 1 %
Eosinophils Absolute: 235 cells/uL (ref 15–500)
Eosinophils Relative: 2.9 %
HCT: 47.3 % (ref 38.5–50.0)
Hemoglobin: 15.4 g/dL (ref 13.2–17.1)
Lymphs Abs: 2608 cells/uL (ref 850–3900)
MCH: 30 pg (ref 27.0–33.0)
MCHC: 32.6 g/dL (ref 32.0–36.0)
MCV: 92 fL (ref 80.0–100.0)
MPV: 11.1 fL (ref 7.5–12.5)
Monocytes Relative: 7.6 %
Neutro Abs: 4560 cells/uL (ref 1500–7800)
Neutrophils Relative %: 56.3 %
Platelets: 229 10*3/uL (ref 140–400)
RBC: 5.14 10*6/uL (ref 4.20–5.80)
RDW: 13.1 % (ref 11.0–15.0)
Total Lymphocyte: 32.2 %
WBC: 8.1 10*3/uL (ref 3.8–10.8)

## 2021-04-11 LAB — URIC ACID: Uric Acid, Serum: 4.9 mg/dL (ref 4.0–8.0)

## 2021-04-11 NOTE — Telephone Encounter (Signed)
Next Visit: 09/17/2021  Last Visit: 08/27/2020  Last Fill: 12/03/2020  DX: Idiopathic chronic gout of multiple sites without   Current Dose per office note 08/27/2020: Allopurinol 300 mg 1 tablet by mouth daily.  Labs: 04/10/2021 CBC and CMP WNL.  Uric acid is within the desirable range.  Okay to refill Allopurinol?

## 2021-07-08 ENCOUNTER — Other Ambulatory Visit: Payer: Self-pay | Admitting: Physician Assistant

## 2021-07-08 NOTE — Telephone Encounter (Signed)
Next Visit: 09/17/2021   Last Visit: 08/27/2020   Last Fill:04/11/2021   DX: Idiopathic chronic gout of multiple sites without    Current Dose per office note 08/27/2020: Allopurinol 300 mg 1 tablet by mouth daily.   Labs: 04/10/2021 CBC and CMP WNL.  Uric acid is within the desirable range.   Okay to refill Allopurinol?

## 2021-08-22 DIAGNOSIS — F331 Major depressive disorder, recurrent, moderate: Secondary | ICD-10-CM | POA: Insufficient documentation

## 2021-08-26 ENCOUNTER — Other Ambulatory Visit: Payer: Self-pay

## 2021-08-26 ENCOUNTER — Encounter: Payer: Self-pay | Admitting: Orthopedic Surgery

## 2021-08-26 ENCOUNTER — Ambulatory Visit: Payer: BC Managed Care – PPO | Admitting: Orthopedic Surgery

## 2021-08-26 VITALS — BP 128/76 | HR 60 | Ht 70.0 in | Wt 242.2 lb

## 2021-08-26 DIAGNOSIS — M7702 Medial epicondylitis, left elbow: Secondary | ICD-10-CM | POA: Diagnosis not present

## 2021-08-26 NOTE — Progress Notes (Signed)
Chief Complaint  Patient presents with   Elbow Pain    RT inside elbow pain X 2 mths DOI 06/21/21    HPI: 61 year old male right-hand-dominant presents with medial elbow pain.  No evidence of trauma.  He said he was leaving some heavy water jugs but does not think that was it.  He did try some ice and some ibuprofen  No numbness or tingling on the medial side of the arm  Past Medical History:  Diagnosis Date   Allergy    Anemia    Asthma    Blood transfusion without reported diagnosis    Cancer ([place])    melanoma back   DISLOCATION CLOSED INTERPHALANGEAL HAND 03/26/2009   Qualifier: Diagnosis of  By: Aline Brochure MD, Myrene Bougher     Diverticulosis    H/O: gout    Hx of adenomatous colonic polyps 10/17/1999   Internal hemorrhoids    Kidney stone    Septic arthritis of knee, right ([place]) 10/29/2010   Tubular adenoma     BP 128/76    Pulse 60    Ht 5\' 10"  (1.778 m)    Wt 242 lb 3.2 oz (109.9 kg)    BMI 34.75 kg/m    General appearance: Well-developed well-nourished no gross deformities  Cardiovascular normal pulse and perfusion normal color without edema  Neurologically no sensation loss or deficits or pathologic reflexes  Psychological: Awake alert and oriented x3 mood and affect normal  Skin no lacerations or ulcerations no nodularity no palpable masses, no erythema or nodularity  Musculoskeletal: Tenderness over the medial epicondyle just distal to it normal grip strength negative Tinel's  Imaging no x-rays  A/P  Golfers elbow  Recommend injection Recommend brace Continue the ibuprofen and ice  Procedure note  Injection  Verbal consent was obtained to inject the right elbow  Timeout procedure was completed to confirm injection site  Diagnosis golfers elbow  Medications used Depo-Medrol 40 mg Lidocaine 1% plain 3 cc  Anesthesia was provided by ethyl chloride spray  Prep was performed with alcohol  Technique of injection standard 25-gauge needle  No  complications were noted

## 2021-08-27 ENCOUNTER — Encounter: Payer: Self-pay | Admitting: Internal Medicine

## 2021-09-03 NOTE — Progress Notes (Deleted)
Office Visit Note  Patient: Dennis Oliver             Date of Birth: 30-Apr-1961           MRN: [id number]             PCP: Michael Boston, MD Referring: No ref. provider found Visit Date: 09/17/2021 Occupation: @GUAROCC @  Subjective:  No chief complaint on file.   History of Present Illness: Dennis Oliver is a 61 y.o. male ***   Activities of Daily Living:  Patient reports morning stiffness for *** {minute/hour:19697}.   Patient {ACTIONS;DENIES/REPORTS:21021675::"Denies"} nocturnal pain.  Difficulty dressing/grooming: {ACTIONS;DENIES/REPORTS:21021675::"Denies"} Difficulty climbing stairs: {ACTIONS;DENIES/REPORTS:21021675::"Denies"} Difficulty getting out of chair: {ACTIONS;DENIES/REPORTS:21021675::"Denies"} Difficulty using hands for taps, buttons, cutlery, and/or writing: {ACTIONS;DENIES/REPORTS:21021675::"Denies"}  No Rheumatology ROS completed.   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   Osteoarthritis of right knee 08/28/2017   Idiopathic chronic gout of multiple sites without tophus 09/04/2016   Primary osteoarthritis of both knees 09/04/2016   Anal skin tag 01/08/2015   Gout 10/29/2010   Asthma 10/29/2010   Allergic rhinitis 10/29/2010   Nephrolithiasis 10/29/2010   Hemorrhoids, internal, with bleeding 10/29/2010   ASTHMA 03/26/2009   Hx of adenomatous colonic polyps + FHx CRCA 10/17/1999    Past Medical History:  Diagnosis Date   Allergy    Anemia    Asthma    Blood transfusion without reported diagnosis    Cancer ([place])    melanoma back   DISLOCATION CLOSED INTERPHALANGEAL HAND  03/26/2009   Qualifier: Diagnosis of  By: Aline Brochure MD, [place]     Diverticulosis    H/O: gout    Hx of adenomatous colonic polyps 10/17/1999   Internal hemorrhoids    Kidney stone    Septic arthritis of knee, right ([place]) 10/29/2010   Tubular adenoma     Family History  Problem Relation Age of Onset   Colon cancer Mother 46   Bladder Cancer Father    Heart attack Father    Healthy Brother    Healthy Brother    Healthy Son    Colon polyps Neg Hx    Esophageal cancer Neg Hx    Rectal cancer Neg Hx    Stomach cancer Neg Hx    Past Surgical History:  Procedure Laterality Date   COLONOSCOPY     HEMORRHOID BANDING  2016   KIDNEY STONE SURGERY     KNEE ARTHROSCOPY  FEB THEN 10/01/2010   right knee; HAD INFECTION REPEAT ARTHROSCOPY   KNEE ARTHROSCOPY W/ ACL RECONSTRUCTION  20 YEARS AGO   VASECTOMY  2016   Social History   Social History Narrative   Not on file   Immunization History  Administered Date(s) Administered   Moderna Sars-Covid-2 Vaccination 09/29/2019, 11/01/2019, 07/13/2020     Objective: Vital Signs: There were no vitals taken for this visit.   Physical Exam   Musculoskeletal Exam: ***  CDAI Exam: CDAI Score: -- Patient Global: --; Provider Global: -- Swollen: --; Tender: -- Joint Exam 09/17/2021   No joint exam has been documented for this visit   There is currently no information documented on the homunculus. Go  to the Rheumatology activity and complete the homunculus joint exam.  Investigation: No additional findings.  Imaging: No results found.  Recent Labs: Lab Results  Component Value Date   WBC 8.1 04/10/2021   HGB 15.4 04/10/2021   PLT 229 04/10/2021   NA 144 04/10/2021   K 4.9 04/10/2021   CL 107 04/10/2021   CO2 28 04/10/2021   GLUCOSE 97 04/10/2021   BUN 13 04/10/2021   CREATININE 0.97 04/10/2021   BILITOT 0.4 04/10/2021   ALKPHOS 74 09/08/2016   AST 22 04/10/2021   ALT 25 04/10/2021   PROT 7.4 04/10/2021   ALBUMIN 4.3  09/08/2016   CALCIUM 10.0 04/10/2021   GFRAA 94 08/27/2020    Speciality Comments: No specialty comments available.  Procedures:  No procedures performed Allergies: Shellfish allergy   Assessment / Plan:     Visit Diagnoses: No diagnosis found.  Orders: No orders of the defined types were placed in this encounter.  No orders of the defined types were placed in this encounter.   Face-to-face time spent with patient was *** minutes. Greater than 50% of time was spent in counseling and coordination of care.  Follow-Up Instructions: No follow-ups on file.   Earnestine Mealing, CMA  Note - This record has been created using Editor, commissioning.  Chart creation errors have been sought, but may not always  have been located. Such creation errors do not reflect on  the standard of medical care.

## 2021-09-13 NOTE — Progress Notes (Signed)
Office Visit Note  Patient: Dennis Oliver             Date of Birth: [birth year]           MRN: [id number]             PCP: Michael Boston, MD Referring: Michael Boston, MD Visit Date: 09/23/2021 Occupation: @GUAROCC @  Subjective:  Medication monitoring   History of Present Illness: Karo Rog is a 61 y.o. male with history of gout and osteoarthritis.  He denies any signs or symptoms of a gout flare since his last office visit.  He has not needed to take colchicine recently.  He is taking allopurinol 300 mg 1 tablet by mouth daily.  He continues to tolerate allopurinol without any side effects.  He has been avoiding shellfish and red meat as encouraged.  He states he recently had an injection for management of right golfers elbow which has alleviated his symptoms.  He has occasional discomfort in his right knee joint but denies any joint swelling at this time.  He denies any other joint pain or inflammation currently.  He states he was started on Ozempic about 1 week ago but denies any other new medications or medical conditions.    Activities of Daily Living:  Patient reports morning stiffness for 0 minutes.   Patient Denies nocturnal pain.  Difficulty dressing/grooming: Denies Difficulty climbing stairs: Denies Difficulty getting out of chair: Denies Difficulty using hands for taps, buttons, cutlery, and/or writing: Denies  Review of Systems  Constitutional:  Negative for fatigue.  HENT:  Negative for mouth sores, mouth dryness and nose dryness.   Eyes:  Negative for pain, itching and dryness.  Respiratory:  Negative for shortness of breath and difficulty breathing.   Cardiovascular:  Negative for chest pain and palpitations.  Gastrointestinal:  Negative for blood in stool, constipation and diarrhea.  Endocrine: Negative for increased urination.  Genitourinary:  Negative for difficulty urinating.  Musculoskeletal:  Positive for joint pain and joint pain. Negative for  joint swelling, myalgias, morning stiffness, muscle tenderness and myalgias.  Skin:  Negative for color change, rash and redness.  Allergic/Immunologic: Negative for susceptible to infections.  Neurological:  Negative for dizziness, numbness, headaches, memory loss and weakness.  Hematological:  Negative for bruising/bleeding tendency.  Psychiatric/Behavioral:  Negative for confusion.    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   Osteoarthritis of right knee 08/28/2017   Idiopathic chronic gout of multiple sites without tophus 09/04/2016   Primary osteoarthritis of both knees 09/04/2016   Anal skin tag 01/08/2015   Gout 10/29/2010   Asthma 10/29/2010   Allergic rhinitis 10/29/2010   Nephrolithiasis 10/29/2010   Hemorrhoids, internal, with bleeding 10/29/2010   ASTHMA 03/26/2009   Hx of adenomatous colonic polyps + FHx CRCA 10/17/1999    Past Medical History:  Diagnosis Date   Allergy    Anemia    Asthma    Blood transfusion without reported diagnosis    Cancer ([place])    melanoma back   DISLOCATION CLOSED INTERPHALANGEAL HAND 03/26/2009   Qualifier: Diagnosis of  By: Aline Brochure MD, Dorothyann Peng     Diverticulosis  H/O: gout    Hx of adenomatous colonic polyps 10/17/1999   Internal hemorrhoids    Kidney stone    Septic arthritis of knee, right ([place]) 10/29/2010   Tubular adenoma     Family History  Problem Relation Age of Onset   Colon cancer Mother 73   Bladder Cancer Father    Heart attack Father    Healthy Brother    Healthy Brother    Healthy Son    Colon polyps Neg Hx    Esophageal cancer Neg Hx    Rectal  cancer Neg Hx    Stomach cancer Neg Hx    Past Surgical History:  Procedure Laterality Date   COLONOSCOPY     HEMORRHOID BANDING  2016   KIDNEY STONE SURGERY     KNEE ARTHROSCOPY  FEB THEN 10/01/2010   right knee; HAD INFECTION REPEAT ARTHROSCOPY   KNEE ARTHROSCOPY W/ ACL RECONSTRUCTION  20 YEARS AGO   VASECTOMY  2016   Social History   Social History Narrative   Not on file   Immunization History  Administered Date(s) Administered   Moderna Sars-Covid-2 Vaccination 09/29/2019, 11/01/2019, 07/13/2020     Objective: Vital Signs: BP 129/85 (BP Location: Left Arm, Patient Position: Sitting, Cuff Size: Normal)    Pulse 87    Ht 5\' 9"  (1.753 m)    Wt 234 lb (106.1 kg)    BMI 34.56 kg/m    Physical Exam Vitals and nursing note reviewed.  Constitutional:      Appearance: He is well-developed.  HENT:     Head: Normocephalic and atraumatic.  Eyes:     Conjunctiva/sclera: Conjunctivae normal.     Pupils: Pupils are equal, round, and reactive to light.  Pulmonary:     Effort: Pulmonary effort is normal.  Abdominal:     Palpations: Abdomen is soft.  Musculoskeletal:     Cervical back: Normal range of motion and neck supple.  Skin:    General: Skin is warm and dry.     Capillary Refill: Capillary refill takes less than 2 seconds.  Neurological:     Mental Status: He is alert and oriented to person, place, and time.  Psychiatric:        Behavior: Behavior normal.     Musculoskeletal Exam: C-spine, thoracic spine, lumbar spine have good range of motion with no discomfort.  Shoulder joints have good range of motion with no discomfort.  Tenderness over the medial epicondyle of the right elbow.  Wrist joints, MCPs, PIPs, DIPs have good range of motion with no synovitis.  Some PIP and DIP thickening consistent with osteoarthritis of both hands.  Complete fist formation bilaterally.  Hip joints have good range of motion with no groin pain.  No tenderness palpation over trochanteric  bursa bilaterally.  Right knee joint has some warmth but no effusion.  Left knee joint has good range of motion with no warmth or effusion.  Ankle joints have good range of motion with no tenderness or joint swelling.  No tenderness over MTP joints.  Thickening over bilateral first MTP joints noted.  Hammertoes of bilateral second toes noted.  CDAI Exam: CDAI Score: -- Patient Global: --; Provider Global: -- Swollen: --; Tender: -- Joint Exam 09/23/2021   No joint exam has been documented for this visit   There is currently no information documented on the homunculus. Go to the Rheumatology activity and complete the homunculus joint exam.  Investigation: No additional findings.  Imaging: No results found.  Recent Labs: Lab Results  Component Value Date   WBC 8.1 04/10/2021   HGB 15.4 04/10/2021   PLT 229 04/10/2021   NA 144 04/10/2021   K 4.9 04/10/2021   CL 107 04/10/2021   CO2 28 04/10/2021   GLUCOSE 97 04/10/2021   BUN 13 04/10/2021   CREATININE 0.97 04/10/2021   BILITOT 0.4 04/10/2021   ALKPHOS 74 09/08/2016   AST 22 04/10/2021   ALT 25 04/10/2021   PROT 7.4 04/10/2021   ALBUMIN 4.3 09/08/2016   CALCIUM 10.0 04/10/2021   GFRAA 94 08/27/2020    Speciality Comments: No specialty comments available.  Procedures:  No procedures performed Allergies: Shellfish allergy   Assessment / Plan:     Visit Diagnoses: Idiopathic chronic gout of multiple sites without tophus - uric acid: 04/10/2021 4.9: He has not had any signs or symptoms of a gout flare recently.  He is clinically doing well taking allopurinol 300 mg 1 tablet by mouth daily.  He has not needed to take colchicine since prior to his last office visit.  His uric acid was within the desirable range: 4.9 on 04/10/2021.  He has had updated lab work ordered by his PCP so we will call to obtain these records.  Discussed the importance of avoiding a purine rich diet.  He has not been eating shellfish and tries to limit his  intake of red meat.  He will remain on the current dose of allopurinol.  A refill of allopurinol was sent to the pharmacy today.  He was advised to notify us if he develops signs or symptoms of a gout flare.  He will follow-up in the office in 1 year or sooner if needed.  Medication monitoring encounter - Allopurinol 300 mg 1 tablet by mouth daily.  He takes colchicine 0.6 mg po PRN for gout flares.  He has had recent lab work with his PCP we will call to obtain these records.  He was strongly encouraged to have CBC, CMP, and uric acid checked every 6 months.  He voiced understanding.  Primary osteoarthritis of both knees: He experiences intermittent discomfort in the right knee joint.  On examination today he has some warmth of the right knee but no effusion was noted.  The left knee joint has good range of motion with no warmth or effusion.  Discussed the importance of lower extremity muscle strengthening.  Medial epicondylitis of right elbow: He was evaluated by Dr. Aline Brochure on 08/26/2021.  He underwent a cortisone injection on 08/26/2021 which has alleviated most of his discomfort.  Other medical conditions are listed as follows:  History of renal calculi  History of asthma  History of melanoma: Followed closely by dermatology.  Orders: No orders of the defined types were placed in this encounter.  Meds ordered this encounter  Medications   allopurinol (ZYLOPRIM) 300 MG tablet    Sig: Take 1 tablet (300 mg total) by mouth daily.    Dispense:  90 tablet    Refill:  0     Follow-Up Instructions: Return in about 1 year (around 09/23/2022) for Gout, Osteoarthritis.   Ofilia Neas, PA-C  Note - This record has been created using Dragon software.  Chart creation errors have been sought, but may not always  have been located. Such creation errors do not reflect on  the standard of medical care.

## 2021-09-17 ENCOUNTER — Ambulatory Visit: Payer: BC Managed Care – PPO | Admitting: Rheumatology

## 2021-09-17 DIAGNOSIS — Z87442 Personal history of urinary calculi: Secondary | ICD-10-CM

## 2021-09-17 DIAGNOSIS — Z8709 Personal history of other diseases of the respiratory system: Secondary | ICD-10-CM

## 2021-09-17 DIAGNOSIS — Z5181 Encounter for therapeutic drug level monitoring: Secondary | ICD-10-CM

## 2021-09-17 DIAGNOSIS — Z85820 Personal history of malignant melanoma of skin: Secondary | ICD-10-CM

## 2021-09-17 DIAGNOSIS — M170 Bilateral primary osteoarthritis of knee: Secondary | ICD-10-CM

## 2021-09-17 DIAGNOSIS — M1A09X0 Idiopathic chronic gout, multiple sites, without tophus (tophi): Secondary | ICD-10-CM

## 2021-09-23 ENCOUNTER — Encounter: Payer: Self-pay | Admitting: Physician Assistant

## 2021-09-23 ENCOUNTER — Ambulatory Visit: Payer: BC Managed Care – PPO | Admitting: Physician Assistant

## 2021-09-23 ENCOUNTER — Other Ambulatory Visit: Payer: Self-pay

## 2021-09-23 VITALS — BP 129/85 | HR 87 | Ht 69.0 in | Wt 234.0 lb

## 2021-09-23 DIAGNOSIS — Z5181 Encounter for therapeutic drug level monitoring: Secondary | ICD-10-CM | POA: Diagnosis not present

## 2021-09-23 DIAGNOSIS — M7701 Medial epicondylitis, right elbow: Secondary | ICD-10-CM

## 2021-09-23 DIAGNOSIS — M170 Bilateral primary osteoarthritis of knee: Secondary | ICD-10-CM

## 2021-09-23 DIAGNOSIS — Z85820 Personal history of malignant melanoma of skin: Secondary | ICD-10-CM

## 2021-09-23 DIAGNOSIS — M1A09X0 Idiopathic chronic gout, multiple sites, without tophus (tophi): Secondary | ICD-10-CM | POA: Diagnosis not present

## 2021-09-23 DIAGNOSIS — Z87442 Personal history of urinary calculi: Secondary | ICD-10-CM

## 2021-09-23 DIAGNOSIS — Z8709 Personal history of other diseases of the respiratory system: Secondary | ICD-10-CM

## 2021-09-23 MED ORDER — ALLOPURINOL 300 MG PO TABS: 300.0000 mg | ORAL_TABLET | Freq: Every day | ORAL | 0 refills | Status: DC

## 2021-09-25 ENCOUNTER — Telehealth: Payer: Self-pay | Admitting: *Deleted

## 2021-09-25 NOTE — Telephone Encounter (Signed)
Labs received from:[facility] ? ?Drawn on:08/13/2021 ? ?Reviewed by:Hazel Sams, PA-C ? ?Labs drawn:Hgb A1C, CMP, CBC, Lipid Panel, Uric Acid, TSH, Testosterone ? ?Results:RDW 12.0 ?Uric Acid 5.2 ?Creatinine WNL ?LFTs WNl ? ?Patient on Allopurinol 300 mg po daily.   ?

## 2021-10-03 ENCOUNTER — Ambulatory Visit (AMBULATORY_SURGERY_CENTER): Payer: BC Managed Care – PPO | Admitting: *Deleted

## 2021-10-03 ENCOUNTER — Other Ambulatory Visit: Payer: Self-pay

## 2021-10-03 VITALS — Ht 70.0 in | Wt 230.0 lb

## 2021-10-03 DIAGNOSIS — Z1211 Encounter for screening for malignant neoplasm of colon: Secondary | ICD-10-CM

## 2021-10-03 NOTE — Progress Notes (Signed)

## 2021-10-10 ENCOUNTER — Encounter: Payer: Self-pay | Admitting: Internal Medicine

## 2021-10-17 ENCOUNTER — Ambulatory Visit (AMBULATORY_SURGERY_CENTER): Payer: BC Managed Care – PPO | Admitting: Internal Medicine

## 2021-10-17 ENCOUNTER — Encounter: Payer: Self-pay | Admitting: Internal Medicine

## 2021-10-17 ENCOUNTER — Other Ambulatory Visit: Payer: Self-pay

## 2021-10-17 VITALS — BP 146/87 | HR 66 | Temp 98.0°F | Resp 14 | Ht 70.0 in | Wt 230.0 lb

## 2021-10-17 DIAGNOSIS — D122 Benign neoplasm of ascending colon: Secondary | ICD-10-CM | POA: Diagnosis not present

## 2021-10-17 DIAGNOSIS — D125 Benign neoplasm of sigmoid colon: Secondary | ICD-10-CM | POA: Diagnosis not present

## 2021-10-17 DIAGNOSIS — D124 Benign neoplasm of descending colon: Secondary | ICD-10-CM

## 2021-10-17 DIAGNOSIS — Z86010 Personal history of colonic polyps: Secondary | ICD-10-CM

## 2021-10-17 DIAGNOSIS — D120 Benign neoplasm of cecum: Secondary | ICD-10-CM

## 2021-10-17 DIAGNOSIS — D123 Benign neoplasm of transverse colon: Secondary | ICD-10-CM | POA: Diagnosis not present

## 2021-10-17 MED ORDER — SODIUM CHLORIDE 0.9 % IV SOLN: 500.0000 mL | Freq: Once | INTRAVENOUS | Status: DC

## 2021-10-17 NOTE — Patient Instructions (Addendum)
I found and removed 10 small polyps. ?I will let you know pathology results and when to have another routine colonoscopy by mail and/or My Chart. ? ?I appreciate the opportunity to care for you. ?Gatha Mayer, MD, [facility] ? ?YOU HAD AN ENDOSCOPIC PROCEDURE TODAY AT [facility]:   Refer to the procedure report that was given to you for any specific questions about what was found during the examination.  If the procedure report does not answer your questions, please call your gastroenterologist to clarify.  If you requested that your care partner not be given the details of your procedure findings, then the procedure report has been included in a sealed envelope for you to review at your convenience later. ? ?YOU SHOULD EXPECT: Some feelings of bloating in the abdomen. Passage of more gas than usual.  Walking can help get rid of the air that was put into your GI tract during the procedure and reduce the bloating. If you had a lower endoscopy (such as a colonoscopy or flexible sigmoidoscopy) you may notice spotting of blood in your stool or on the toilet paper. If you underwent a bowel prep for your procedure, you may not have a normal bowel movement for a few days. ? ?Please Note:  You might notice some irritation and congestion in your nose or some drainage.  This is from the oxygen used during your procedure.  There is no need for concern and it should clear up in a day or so. ? ?SYMPTOMS TO REPORT IMMEDIATELY: ? ?Following lower endoscopy (colonoscopy or flexible sigmoidoscopy): ? Excessive amounts of blood in the stool ? Significant tenderness or worsening of abdominal pains ? Swelling of the abdomen that is new, acute ? Fever of 100?F or higher ? ?Following upper endoscopy (EGD) ? Vomiting of blood or coffee ground material ? New chest pain or pain under the shoulder blades ? Painful or persistently difficult swallowing ? New shortness of breath ? Fever of 100?F or higher ? Black, tarry-looking  stools ? ?For urgent or emergent issues, a gastroenterologist can be reached at any hour by calling [phone]. ?Do not use MyChart messaging for urgent concerns.  ? ? ?DIET:  We do recommend a small meal at first, but then you may proceed to your regular diet.  Drink plenty of fluids but you should avoid alcoholic beverages for 24 hours. ? ?ACTIVITY:  You should plan to take it easy for the rest of today and you should NOT DRIVE or use heavy machinery until tomorrow (because of the sedation medicines used during the test).   ? ?FOLLOW UP: ?Our staff will call the number listed on your records 48-72 hours following your procedure to check on you and address any questions or concerns that you may have regarding the information given to you following your procedure. If we do not reach you, we will leave a message.  We will attempt to reach you two times.  During this call, we will ask if you have developed any symptoms of COVID 19. If you develop any symptoms (ie: fever, flu-like symptoms, shortness of breath, cough etc.) before then, please call [phone].  If you test positive for Covid 19 in the 2 weeks post procedure, please call and report this information to [place].   ? ?If any biopsies were taken you will be contacted by phone or by letter within the next 1-3 weeks.  Please call us at [phone] if you have not heard about  the biopsies in 3 weeks.  ? ? ?SIGNATURES/CONFIDENTIALITY: ?You and/or your care partner have signed paperwork which will be entered into your electronic medical record.  These signatures attest to the fact that that the information above on your After Visit Summary has been reviewed and is understood.  Full responsibility of the confidentiality of this discharge information lies with you and/or your care-partner.  ?

## 2021-10-17 NOTE — Progress Notes (Signed)
Sedate, gd SR, tolerated procedure well, VSS, report to RN 

## 2021-10-17 NOTE — Progress Notes (Signed)
Called to room to assist during endoscopic procedure.  Patient ID and intended procedure confirmed with present staff. Received instructions for my participation in the procedure from the performing physician.  

## 2021-10-17 NOTE — Op Note (Signed)
[place] ?Patient Name: Dennis Oliver ?Procedure Date: 10/17/2021 7:25 AM ?MRN: [id number] ?Endoscopist: Gatha Mayer , MD ?Age: 61 ?Referring MD:  ?Date of Birth: 1961/04/20 ?Gender: Male ?Account #: [id number] ?Procedure:                Colonoscopy ?Indications:              Surveillance: Personal history of adenomatous  ?                          polyps on last colonoscopy > 3 years ago, Last  ?                          colonoscopy: 2019 FHx 78 mom age 19 ?Medicines:                Monitored Anesthesia Care ?Procedure:                Pre-Anesthesia Assessment: ?                          - Prior to the procedure, a History and Physical  ?                          was performed, and patient medications and  ?                          allergies were reviewed. The patient's tolerance of  ?                          previous anesthesia was also reviewed. The risks  ?                          and benefits of the procedure and the sedation  ?                          options and risks were discussed with the patient.  ?                          All questions were answered, and informed consent  ?                          was obtained. Prior Anticoagulants: The patient has  ?                          taken no previous anticoagulant or antiplatelet  ?                          agents. ASA Grade Assessment: II - A patient with  ?                          mild systemic disease. After reviewing the risks  ?                          and benefits, the patient was deemed in  ?  satisfactory condition to undergo the procedure. ?                          After obtaining informed consent, the colonoscope  ?                          was passed under direct vision. Throughout the  ?                          procedure, the patient's blood pressure, pulse, and  ?                          oxygen saturations were monitored continuously. The  ?                          Colonoscope was introduced  through the anus and  ?                          advanced to the the cecum, identified by  ?                          appendiceal orifice and ileocecal valve. The  ?                          colonoscopy was somewhat difficult due to  ?                          significant looping. Successful completion of the  ?                          procedure was aided by applying abdominal pressure.  ?                          The patient tolerated the procedure well. The  ?                          quality of the bowel preparation was adequate. The  ?                          bowel preparation used was Miralax via split dose  ?                          instruction. The ileocecal valve, appendiceal  ?                          orifice, and rectum were photographed. ?Scope In: 8:08:34 AM ?Scope Out: 8:38:31 AM ?Total Procedure Duration: 0 hours 29 minutes 57 seconds  ?Findings:                 The perianal and digital rectal examinations were  ?                          normal. Pertinent negatives include normal prostate  ?                          (  size, shape, and consistency). ?                          Eight sessile polyps were found in the sigmoid  ?                          colon, descending colon, transverse colon and  ?                          ascending colon. The polyps were 3 to 6 mm in size.  ?                          These polyps were removed with a cold snare.  ?                          Resection and retrieval were complete. Verification  ?                          of patient identification for the specimen was  ?                          done. Estimated blood loss was minimal. ?                          Two sessile polyps were found in the cecum. The  ?                          polyps were 1 mm in size. These polyps were removed  ?                          with a cold biopsy forceps. Resection and retrieval  ?                          were complete. Verification of patient  ?                           identification for the specimen was done. Estimated  ?                          blood loss was minimal. ?                          Multiple diverticula were found in the sigmoid  ?                          colon. ?                          External and internal hemorrhoids were found. ?                          The exam was otherwise without abnormality on  ?  direct and retroflexion views. ?Complications:            No immediate complications. ?Estimated Blood Loss:     Estimated blood loss was minimal. ?Impression:               - Eight 3 to 6 mm polyps in the sigmoid colon, in  ?                          the descending colon, in the transverse colon and  ?                          in the ascending colon, removed with a cold snare.  ?                          Resected and retrieved. ?                          - Two 1 mm polyps in the cecum, removed with a cold  ?                          biopsy forceps. Resected and retrieved. ?                          - Diverticulosis in the sigmoid colon. ?                          - External and internal hemorrhoids. ?                          - The examination was otherwise normal on direct  ?                          and retroflexion views. ?                          - Personal history of colonic polyps + FHx CRCA mom  ?                          at 58 ?                          2001 1 adenoma ?                          2012 1 adenoma ?                          10/30/2017 5 adenomas ?Recommendation:           - Patient has a contact number available for  ?                          emergencies. The signs and symptoms of potential  ?                          delayed complications were discussed with the  ?  patient. Return to normal activities tomorrow.  ?                          Written discharge instructions were provided to the  ?                          patient. ?                          - Resume previous diet. ?                           - Continue present medications. ?                          - Repeat colonoscopy is recommended for  ?                          surveillance. The colonoscopy date will be  ?                          determined after pathology results from today's  ?                          exam become available for review. EXTRA VS  ?                          DIFFERENT PREP - SIG LAVAGE AND SUCTIONING NEEDED  ?                          TODAY ?Gatha Mayer, MD ?10/17/2021 8:50:16 AM ?This report has been signed electronically. ?

## 2021-10-17 NOTE — Progress Notes (Signed)
[facility] History and Physical ? ? ?Primary Care Physician:  Michael Boston, MD ? ? ?Reason for Procedure:   Hx polyps + FHx CRCA ? ?Plan:    colonoscopy ? ? ? ? ?HPI: Dennis Oliver is a 61 y.o. male w/ hx colon polyps + FHx colon cancer ?2001 1 adenoma ?2012 1 adenoma ?10/30/2017 5 adenomas ? ?Past Medical History:  ?Diagnosis Date  ? Allergy   ? Anemia   ? Asthma   ? Blood transfusion without reported diagnosis   ? Cancer [facility])   ? melanoma back  ? DISLOCATION CLOSED INTERPHALANGEAL HAND 03/26/2009  ? Qualifier: Diagnosis of  By: Aline Brochure MD, Dorothyann Peng    ? Diverticulosis   ? H/O: gout   ? Hx of adenomatous colonic polyps 10/17/1999  ? Internal hemorrhoids   ? Kidney stone   ? Neuromuscular disorder ([place])   ? Septic arthritis of knee, right ([place]) 10/29/2010  ? Tubular adenoma   ? ? ?Past Surgical History:  ?Procedure Laterality Date  ? COLONOSCOPY    ? HEMORRHOID BANDING  2016  ? KIDNEY STONE SURGERY    ? KNEE ARTHROSCOPY  FEB THEN 10/01/2010  ? right knee; HAD INFECTION REPEAT ARTHROSCOPY  ? KNEE ARTHROSCOPY W/ ACL RECONSTRUCTION  20 YEARS AGO  ? VASECTOMY  2016  ? ? ?Prior to Admission medications   ?Medication Sig Start Date End Date Taking? Authorizing Provider  ?allopurinol (ZYLOPRIM) 300 MG tablet Take 1 tablet (300 mg total) by mouth daily. 09/23/21  Yes Ofilia Neas, PA-C  ?ASPIRIN 81 PO Take 81 mg by mouth daily.   Yes [provider]  ?BREO ELLIPTA 100-25 MCG/INH AEPB Take 1 puff by mouth daily.  09/01/14  Yes [provider]  ?fexofenadine (ALLEGRA) 180 MG tablet Take 180 mg by mouth daily.   Yes [provider]  ?metFORMIN (GLUCOPHAGE-XR) 750 MG 24 hr tablet daily. 08/24/19  Yes [provider]  ?montelukast (SINGULAIR) 10 MG tablet Take 1 tablet by mouth daily. 01/11/20  Yes [provider]  ?Multiple Vitamin (MULTIVITAMIN PO) Take by mouth daily.   Yes [provider]  ?rosuvastatin (CRESTOR) 20 MG tablet Take 20 mg by mouth at  bedtime. 08/24/19  Yes [provider]  ?sildenafil (VIAGRA) 25 MG tablet Take 25 mg by mouth daily as needed for erectile dysfunction.   Yes [provider]  ?AXIRON 30 MG/ACT SOLN Apply 30 mg topically as needed. ?Patient not taking: Reported on 10/17/2021 09/01/14   [provider]  ?Coenzyme Q10 100 MG capsule Take 1 tablet by mouth daily. ?Patient not taking: Reported on 10/17/2021 01/11/20   [provider]  ?colchicine 0.6 MG tablet Take 1 tablet (0.6 mg total) by mouth daily as needed. ?Patient not taking: Reported on 10/03/2021 04/02/18   Ofilia Neas, PA-C  ?[facility] HFA 108 (90 BASE) MCG/ACT inhaler Inhale 1-2 puffs into the lungs every 4 (four) hours as needed. ?Patient not taking: Reported on 10/17/2021 10/04/14   [provider]  ?VITAMIN D PO Take by mouth daily. ?Patient not taking: Reported on 09/23/2021    [provider]  ? ? ?Current Outpatient Medications  ?Medication Sig Dispense Refill  ? allopurinol (ZYLOPRIM) 300 MG tablet Take 1 tablet (300 mg total) by mouth daily. 90 tablet 0  ? ASPIRIN 81 PO Take 81 mg by mouth daily.    ? BREO ELLIPTA 100-25 MCG/INH AEPB Take 1 puff by mouth daily.   5  ? fexofenadine (ALLEGRA)  180 MG tablet Take 180 mg by mouth daily.    ? metFORMIN (GLUCOPHAGE-XR) 750 MG 24 hr tablet daily.    ? montelukast (SINGULAIR) 10 MG tablet Take 1 tablet by mouth daily.    ? Multiple Vitamin (MULTIVITAMIN PO) Take by mouth daily.    ? rosuvastatin (CRESTOR) 20 MG tablet Take 20 mg by mouth at bedtime.    ? sildenafil (VIAGRA) 25 MG tablet Take 25 mg by mouth daily as needed for erectile dysfunction.    ? AXIRON 30 MG/ACT SOLN Apply 30 mg topically as needed. (Patient not taking: Reported on 10/17/2021)  5  ? Coenzyme Q10 100 MG capsule Take 1 tablet by mouth daily. (Patient not taking: Reported on 10/17/2021)    ? colchicine 0.6 MG tablet Take 1 tablet (0.6 mg total) by mouth daily as needed. (Patient not taking: Reported on 10/03/2021) 30  tablet 2  ? PROAIR HFA 108 (90 BASE) MCG/ACT inhaler Inhale 1-2 puffs into the lungs every 4 (four) hours as needed. (Patient not taking: Reported on 10/17/2021)  5  ? VITAMIN D PO Take by mouth daily. (Patient not taking: Reported on 09/23/2021)    ? ?Current Facility-Administered Medications  ?Medication Dose Route Frequency Provider Last Rate Last Admin  ? 0.9 %  sodium chloride infusion  500 mL Intravenous Once Gatha Mayer, MD      ? 0.9 %  sodium chloride infusion  500 mL Intravenous Once Gatha Mayer, MD      ? ? ?Allergies as of 10/17/2021 - Review Complete 10/17/2021  ?Allergen Reaction Noted  ? Shellfish allergy Hives 02/11/2016  ? ? ?Family History  ?Problem Relation Age of Onset  ? Colon cancer Mother 11  ? Bladder Cancer Father   ? Heart attack Father   ? Healthy Brother   ? Healthy Brother   ? Healthy Son   ? Colon polyps Neg Hx   ? Esophageal cancer Neg Hx   ? Rectal cancer Neg Hx   ? Stomach cancer Neg Hx   ? ? ?Social History  ? ?Socioeconomic History  ? Marital status: Married  ?  Spouse name: Not on file  ? Number of children: Not on file  ? Years of education: Not on file  ? Highest education level: Not on file  ?Occupational History  ? Not on file  ?Tobacco Use  ? Smoking status: Never  ?  Passive exposure: Past  ? Smokeless tobacco: Never  ?Vaping Use  ? Vaping Use: Never used  ?Substance and Sexual Activity  ? Alcohol use: Yes  ?  Alcohol/week: 1.0 standard drink  ?  Types: 1 Glasses of wine per week  ?  Comment: occ  ? Drug use: No  ? ? ? ?Review of Systems: ? ?All other review of systems negative except as mentioned in the HPI. ? ?Physical Exam: ?Vital signs ?BP 128/71   Pulse 83   Temp 98 ?F (36.7 ?C)   Ht '5\' 10"'$  (1.778 m)   Wt 230 lb (104.3 kg)   SpO2 97%   BMI 33.00 kg/m?  ? ?General:   Alert,  Well-developed, well-nourished, pleasant and cooperative in NAD ?Lungs:  Clear throughout to auscultation.   ?Heart:  Regular rate and rhythm; no murmurs, clicks, rubs,  or  gallops. ?Abdomen:  Soft, nontender and nondistended. Normal bowel sounds.   ?Neuro/Psych:  Alert and cooperative. Normal mood and affect. A and O x 3 ? ? ?'@Krithika Tome'$  Simonne Maffucci, MD, Marval Regal ?[facility] ?  [phone] (pager) ?10/17/2021 7:57 AM@ ? ?

## 2021-10-21 ENCOUNTER — Telehealth: Payer: Self-pay

## 2021-10-21 NOTE — Telephone Encounter (Signed)
?  Follow up Call- ? ? ?  10/17/2021  ?  7:20 AM  ?Call back number  ?Post procedure Call Back phone  # [phone]  ?Permission to leave phone message Yes  ?  ? ?Patient questions: ? ?Do you have a fever, pain , or abdominal swelling? No. ?Pain Score  0 * ? ?Have you tolerated food without any problems? Yes.   ? ?Have you been able to return to your normal activities? Yes.   ? ?Do you have any questions about your discharge instructions: ?Diet   No. ?Medications  No. ?Follow up visit  No. ? ?Do you have questions or concerns about your Care? No. ? ?Actions: ?* If pain score is 4 or above: ?No action needed, pain <4. ? ? ?

## 2021-10-23 ENCOUNTER — Encounter: Payer: Self-pay | Admitting: Internal Medicine

## 2022-02-22 ENCOUNTER — Other Ambulatory Visit: Payer: Self-pay | Admitting: Physician Assistant

## 2022-02-24 NOTE — Telephone Encounter (Signed)
Next Visit: Due February 2024. Message sent to the front to schedule.   Last Visit: 09/23/2021  Last Fill: 09/23/2021  DX: Idiopathic chronic gout of multiple sites without tophus  Current Dose per office note 09/23/2021: Allopurinol 300 mg 1 tablet by mouth daily.  Labs: 08/13/2021 RDW 12.0  Creatinine WNL LFTs WNL  Uric Acid: 08/13/2021 Uric Acid 5.2  Patient advised he is due to update labs. Patient states he recently had labs done at [facility]. Patient states he will contact them and have results faxed.   Okay to refill Allopurinol?

## 2022-02-24 NOTE — Telephone Encounter (Signed)
Please schedule patient a follow up visit. Patient due February 2024. Thanks!  

## 2022-02-24 NOTE — Telephone Encounter (Signed)
Patient states he doesn't have his schedule for 2024 and will call back to schedule.

## 2022-05-20 ENCOUNTER — Telehealth: Payer: Self-pay | Admitting: *Deleted

## 2022-05-20 NOTE — Telephone Encounter (Signed)
Please schedule patient for a follow up visit. Thanks! 

## 2022-05-20 NOTE — Telephone Encounter (Signed)
Attempted to contact patient and left message to advise patient to call the office and schedule follow up appointment.  

## 2022-05-20 NOTE — Telephone Encounter (Addendum)
Labs received from:[facility]  Drawn on:05/13/2022  Reviewed by:Hazel Sams, PA-C  Labs drawn:Uric Acid, CMP, CBC  Results: Uric Acid 4.6    ALT 42  Patient is on Allopurinol 300 mg 1 tablet po daily and Colchicine 0.6 mg po PRN for gout flares.   Patient was last seen 09/23/2021 and has not follow up on file.   Per Hazel Sams, PA-C, please schedule patient for a routine follow up visit.

## 2022-05-28 ENCOUNTER — Other Ambulatory Visit: Payer: Self-pay | Admitting: Otolaryngology

## 2022-05-28 DIAGNOSIS — H9122 Sudden idiopathic hearing loss, left ear: Secondary | ICD-10-CM

## 2022-05-29 ENCOUNTER — Ambulatory Visit
Admission: RE | Admit: 2022-05-29 | Discharge: 2022-05-29 | Disposition: A | Discharge status: Home / Self Care | Payer: BC Managed Care – PPO | Source: Ambulatory Visit | Attending: Otolaryngology | Admitting: Otolaryngology

## 2022-05-29 ENCOUNTER — Other Ambulatory Visit: Payer: Self-pay | Admitting: Physician Assistant

## 2022-05-29 DIAGNOSIS — H9122 Sudden idiopathic hearing loss, left ear: Secondary | ICD-10-CM

## 2022-05-29 MED ORDER — GADOPICLENOL 0.5 MMOL/ML IV SOLN
10.0000 mL | Freq: Once | INTRAVENOUS | Status: AC | PRN
  Administered 2022-05-29: 10 mL via INTRAVENOUS

## 2022-06-04 HISTORY — PX: REPLACEMENT TOTAL KNEE: SUR1224

## 2022-06-06 DIAGNOSIS — M25562 Pain in left knee: Secondary | ICD-10-CM | POA: Insufficient documentation

## 2022-06-06 DIAGNOSIS — M25661 Stiffness of right knee, not elsewhere classified: Secondary | ICD-10-CM | POA: Insufficient documentation

## 2022-06-06 DIAGNOSIS — M25662 Stiffness of left knee, not elsewhere classified: Secondary | ICD-10-CM | POA: Insufficient documentation

## 2022-06-30 ENCOUNTER — Other Ambulatory Visit: Payer: Self-pay | Admitting: *Deleted

## 2022-06-30 MED ORDER — ALLOPURINOL 300 MG PO TABS: 300.0000 mg | ORAL_TABLET | Freq: Every day | ORAL | 0 refills | Status: DC

## 2022-06-30 NOTE — Telephone Encounter (Signed)
Please call patient to schedule f/u appt Thank you.  Return in about 1 year (around 09/23/2022) for Gout, Osteoarthritis.

## 2022-06-30 NOTE — Telephone Encounter (Signed)
Patient requesting refill - had knee replacement x 2 weeks.   Next Visit: Message sent to front desk to schedule f/u appt, Return in about 1 year (around 09/23/2022) for Gout, Osteoarthritis.   Last Visit: 09/23/2021  Last Fill: 02/24/2022  DX: Idiopathic chronic gout of multiple sites without tophus   Current Dose per office note 09/23/2021: Allopurinol 300 mg 1 tablet by mouth daily   Labs: Uric Acid 4.6 05/13/2022 04/10/2021 CBC and CMP WNL   Okay to refill Allopurinol?

## 2022-07-10 ENCOUNTER — Other Ambulatory Visit: Payer: Self-pay | Admitting: Rheumatology

## 2022-07-10 MED ORDER — ALLOPURINOL 300 MG PO TABS: 300.0000 mg | ORAL_TABLET | Freq: Every day | ORAL | 0 refills | Status: DC

## 2022-07-10 NOTE — Telephone Encounter (Signed)
Patient scheduled follow-up appointment with Lovena Le on 08/11/21 which was her first available appt.  Patient states his last refill of Allopurinol was only written for 30 day supply because he was due for an office visit and states his prescription will run out before his appointment.  Patient requested a partial refill to hold him over until his appointment.  Patient states he had his labs last month with his PCP Dr. Jacalyn Lefevre with [facility].

## 2022-07-10 NOTE — Telephone Encounter (Signed)
Next Visit: 08/11/2022  Last Visit: 09/23/2021   Last Fill: 06/30/2022 (30 day supply)  DX: Idiopathic chronic gout of multiple sites without tophus   Current Dose per office note 09/23/2021: Allopurinol 300 mg 1 tablet by mouth daily.   Labs: 05/13/2022 Uric Acid 4.6  Patient is calling PCP to have other labs faxed to our office.   Okay to refill Allopurinol?

## 2022-07-10 NOTE — Telephone Encounter (Signed)
Ok to refill 

## 2022-07-31 NOTE — Progress Notes (Signed)
Office Visit Note  Patient: Dennis Oliver             Date of Birth: July 20, 1961           MRN: [id number]             PCP: Michael Boston, MD Referring: Michael Boston, MD Visit Date: 08/11/2022 Occupation: '@GUAROCC'$ @  Subjective:  Mediation monitoring   History of Present Illness: Dennis Oliver is a 62 y.o. male with history of gout and osteoarthritis. He is taking allopurinol 300 mg daily and colchicine 0.6 mg 1 tablet daily as needed during flares.  He continues to tolerate allopurinol without any side effects and has not missed any doses recently.  He denies any signs or symptoms of a gout flare.  Patient reports that 10 weeks ago on Wednesday he had his right knee replaced by Dr. Wynelle Link.  He states that he has not had any complications and has completed physical therapy.  He plans on continuing home exercises but at this point has good range of motion and his discomfort has been very minimal.  He denies any other joint pain or joint swelling at this time.   Activities of Daily Living:  Patient reports morning stiffness for 0 minutes.   Patient Denies nocturnal pain.  Difficulty dressing/grooming: Denies Difficulty climbing stairs: Denies Difficulty getting out of chair: Denies Difficulty using hands for taps, buttons, cutlery, and/or writing: Denies  Review of Systems  Constitutional:  Negative for fatigue.  HENT:  Negative for mouth sores and mouth dryness.   Eyes:  Negative for dryness.  Respiratory:  Negative for shortness of breath.   Cardiovascular:  Negative for chest pain and palpitations.  Gastrointestinal:  Negative for blood in stool, constipation and diarrhea.  Endocrine: Negative for increased urination.  Genitourinary:  Negative for involuntary urination.  Musculoskeletal:  Negative for joint pain, gait problem, joint pain, joint swelling, myalgias, muscle weakness, morning stiffness, muscle tenderness and myalgias.  Skin:  Negative for color change, rash,  hair loss and sensitivity to sunlight.  Allergic/Immunologic: Negative for susceptible to infections.  Neurological:  Negative for dizziness and headaches.  Hematological:  Negative for swollen glands.  Psychiatric/Behavioral:  Negative for depressed mood and sleep disturbance. The patient is not nervous/anxious.     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   Osteoarthritis of right knee 08/28/2017   Idiopathic chronic gout of multiple sites without tophus 09/04/2016   Primary osteoarthritis of both knees 09/04/2016   Anal skin tag 01/08/2015   Gout 10/29/2010   Asthma 10/29/2010   Allergic rhinitis 10/29/2010   Nephrolithiasis 10/29/2010   Hemorrhoids, internal, with bleeding 10/29/2010   ASTHMA 03/26/2009   Hx of adenomatous colonic polyps + FHx CRCA 10/17/1999    Past Medical History:  Diagnosis Date   Allergy    Anemia    Asthma    Blood transfusion without reported diagnosis    Cancer ([place])    melanoma back   DISLOCATION CLOSED INTERPHALANGEAL HAND 03/26/2009   Qualifier: Diagnosis of  By: Aline Brochure MD, Dorothyann Peng     Diverticulosis    H/O: gout    Hx  of adenomatous colonic polyps 10/17/1999   Internal hemorrhoids    Kidney stone    Neuromuscular disorder ([facility])    Septic arthritis of knee, right ([place]) 10/29/2010   Tubular adenoma     Family History  Problem Relation Age of Onset   Colon cancer Mother 63   Bladder Cancer Father    Heart attack Father    Healthy Brother    Healthy Brother    Healthy Son    Colon polyps Neg Hx    Esophageal cancer Neg Hx    Rectal cancer Neg Hx    Stomach cancer Neg  Hx    Past Surgical History:  Procedure Laterality Date   COLONOSCOPY     HEMORRHOID BANDING  2016   KIDNEY STONE SURGERY     KNEE ARTHROSCOPY  FEB THEN 10/01/2010   right knee; HAD INFECTION REPEAT ARTHROSCOPY   KNEE ARTHROSCOPY W/ ACL RECONSTRUCTION  20 YEARS AGO   REPLACEMENT TOTAL KNEE Right 06/04/2022   VASECTOMY  2016   Social History   Social History Narrative   Not on file   Immunization History  Administered Date(s) Administered   Moderna Sars-Covid-2 Vaccination 09/29/2019, 11/01/2019, 07/13/2020     Objective: Vital Signs: BP 127/80 (BP Location: Left Arm, Patient Position: Sitting, Cuff Size: Normal)   Pulse (!) 53   Resp 15   Ht '5\' 9"'$  (1.753 m)   Wt 237 lb 3.2 oz (107.6 kg)   BMI 35.03 kg/m    Physical Exam Vitals and nursing note reviewed.  Constitutional:      Appearance: He is well-developed.  HENT:     Head: Normocephalic and atraumatic.  Eyes:     Conjunctiva/sclera: Conjunctivae normal.     Pupils: Pupils are equal, round, and reactive to light.  Cardiovascular:     Rate and Rhythm: Normal rate and regular rhythm.     Heart sounds: Normal heart sounds.  Pulmonary:     Effort: Pulmonary effort is normal.     Breath sounds: Normal breath sounds.  Abdominal:     General: Bowel sounds are normal.     Palpations: Abdomen is soft.  Musculoskeletal:     Cervical back: Normal range of motion and neck supple.  Skin:    General: Skin is warm and dry.     Capillary Refill: Capillary refill takes less than 2 seconds.  Neurological:     Mental Status: He is alert and oriented to person, place, and time.  Psychiatric:        Behavior: Behavior normal.      Musculoskeletal Exam: C-spine, thoracic spine, lumbar spine have good range of motion.  No midline spinal tenderness.  No SI joint tenderness.  Shoulder joints, elbow joints, wrist joints MCPs, PIPs, DIPs have good range of motion with no synovitis.  Complete fist formation bilaterally.  Hip joints  have good range of motion with no groin pain.  Right knee replacement has good range of motion with warmth but no effusion.  Left knee joint has good range of motion with no warmth or effusion.  Ankle joints have good range of motion with no tenderness or joint swelling.  No tenderness or synovitis over MTP joints.  Right second hammertoe is noted bilaterally, left greater than right.  CDAI Exam: CDAI Score: -- Patient Global: --; Provider Global: -- Swollen: --; Tender: -- Joint Exam 08/11/2022   No joint exam has been documented for this visit   There is currently no information documented on  the homunculus. Go to the Rheumatology activity and complete the homunculus joint exam.  Investigation: No additional findings.  Imaging: No results found.  Recent Labs: Lab Results  Component Value Date   WBC 8.1 04/10/2021   HGB 15.4 04/10/2021   PLT 229 04/10/2021   NA 144 04/10/2021   K 4.9 04/10/2021   CL 107 04/10/2021   CO2 28 04/10/2021   GLUCOSE 97 04/10/2021   BUN 13 04/10/2021   CREATININE 0.97 04/10/2021   BILITOT 0.4 04/10/2021   ALKPHOS 74 09/08/2016   AST 22 04/10/2021   ALT 25 04/10/2021   PROT 7.4 04/10/2021   ALBUMIN 4.3 09/08/2016   CALCIUM 10.0 04/10/2021   GFRAA 94 08/27/2020    Speciality Comments: No specialty comments available.  Procedures:  No procedures performed Allergies: Shellfish allergy   Assessment / Plan:     Visit Diagnoses: Idiopathic chronic gout of multiple sites without tophus - He has not had any signs or symptoms of a gout flare.  He has clinically been doing well taking allopurinol 300 mg 1 tablet by mouth daily.  He has a prescription for colchicine 0.6 mg 1 tablet daily as needed during flares but has not needed to take colchicine recently.  He requested a refill of allopurinol to be sent to the pharmacy. Uric acid was 4.6-within the desirable range on 05/13/2022. He plans on having updated lab work in April 2024 including an updated  uric acid level.  Future orders for CBC, CMP, and uric acid were placed today.  He will follow-up in the office in 6 months or sooner if needed.- Plan: Uric acid  Medication monitoring encounter - Allopurinol 300 mg 1 tablet by mouth daily.  He takes colchicine 0.6 mg po PRN for gout flares.  Patient had lab work drawn on 05/13/2022.  Uric acid within the desirable range: 4.6 on 05/13/2022.  Future orders for CBC, CMP, and uric acid level were placed today.- Plan: CBC with Differential/Platelet, COMPLETE METABOLIC PANEL WITH GFR  S/P total knee replacement, right: Warmth but no effusion.  Performed by Dr. Wynelle Link about 10 weeks ago. No complications.  Completed PT.   Primary osteoarthritis of left knee: Good ROM with no discomfort.  No warmth or effusion noted.  Medial epicondylitis of right elbow - He was evaluated by Dr. Aline Brochure on 08/26/2021.  He underwent a cortisone injection on 08/26/2021 which has alleviated most of his discomfort. Resolved.  No tenderness upon palpation.  Other medical conditions are listed as follows:  History of renal calculi  History of melanoma - Followed closely by dermatology.  History of asthma  Orders: Orders Placed This Encounter  Procedures   CBC with Differential/Platelet   COMPLETE METABOLIC PANEL WITH GFR   Uric acid   Meds ordered this encounter  Medications   allopurinol (ZYLOPRIM) 300 MG tablet    Sig: Take 1 tablet (300 mg total) by mouth daily.    Dispense:  90 tablet    Refill:  0    Follow-Up Instructions: Return in about 6 months (around 02/09/2023) for Osteoarthritis, Gout.   Ofilia Neas, PA-C  Note - This record has been created using Dragon software.  Chart creation errors have been sought, but may not always  have been located. Such creation errors do not reflect on  the standard of medical care.

## 2022-08-05 DIAGNOSIS — H9122 Sudden idiopathic hearing loss, left ear: Secondary | ICD-10-CM | POA: Insufficient documentation

## 2022-08-05 DIAGNOSIS — J320 Chronic maxillary sinusitis: Secondary | ICD-10-CM | POA: Insufficient documentation

## 2022-08-05 DIAGNOSIS — H938X2 Other specified disorders of left ear: Secondary | ICD-10-CM | POA: Insufficient documentation

## 2022-08-11 ENCOUNTER — Ambulatory Visit: Payer: BC Managed Care – PPO | Attending: Physician Assistant | Admitting: Physician Assistant

## 2022-08-11 ENCOUNTER — Encounter: Payer: Self-pay | Admitting: Physician Assistant

## 2022-08-11 VITALS — BP 127/80 | HR 53 | Resp 15 | Ht 69.0 in | Wt 237.2 lb

## 2022-08-11 DIAGNOSIS — M1A09X0 Idiopathic chronic gout, multiple sites, without tophus (tophi): Secondary | ICD-10-CM | POA: Diagnosis not present

## 2022-08-11 DIAGNOSIS — Z87442 Personal history of urinary calculi: Secondary | ICD-10-CM

## 2022-08-11 DIAGNOSIS — Z8709 Personal history of other diseases of the respiratory system: Secondary | ICD-10-CM

## 2022-08-11 DIAGNOSIS — M1712 Unilateral primary osteoarthritis, left knee: Secondary | ICD-10-CM | POA: Diagnosis not present

## 2022-08-11 DIAGNOSIS — Z5181 Encounter for therapeutic drug level monitoring: Secondary | ICD-10-CM | POA: Diagnosis not present

## 2022-08-11 DIAGNOSIS — M7701 Medial epicondylitis, right elbow: Secondary | ICD-10-CM | POA: Diagnosis not present

## 2022-08-11 DIAGNOSIS — Z85820 Personal history of malignant melanoma of skin: Secondary | ICD-10-CM

## 2022-08-11 DIAGNOSIS — M170 Bilateral primary osteoarthritis of knee: Secondary | ICD-10-CM

## 2022-08-11 DIAGNOSIS — Z96651 Presence of right artificial knee joint: Secondary | ICD-10-CM

## 2022-08-11 MED ORDER — ALLOPURINOL 300 MG PO TABS: 300.0000 mg | ORAL_TABLET | Freq: Every day | ORAL | 0 refills | Status: DC

## 2022-09-11 LAB — BASIC METABOLIC PANEL
BUN: 11 (ref 4–21)
CO2: 29 — AB (ref 13–22)
Chloride: 107 (ref 99–108)
Creatinine: 1.1 (ref 0.6–1.3)
Glucose: 110
Potassium: 4.9 mEq/L (ref 3.5–5.1)
Sodium: 139 (ref 137–147)

## 2022-09-11 LAB — COMPREHENSIVE METABOLIC PANEL
A1c: 5.3
Albumin: 4.1 (ref 3.5–5.0)
Calcium: 9.8 (ref 8.7–10.7)
eGFR: 67.8

## 2022-09-11 LAB — HEPATIC FUNCTION PANEL
ALT: 52 U/L — AB (ref 10–40)
AST: 36 (ref 14–40)
Alkaline Phosphatase: 106 (ref 25–125)
Bilirubin, Total: 0.3

## 2022-09-11 LAB — CBC AND DIFFERENTIAL: WBC: 5.3

## 2022-09-25 ENCOUNTER — Encounter: Payer: Self-pay | Admitting: Radiology

## 2022-10-29 ENCOUNTER — Encounter: Payer: Self-pay | Admitting: Internal Medicine

## 2022-11-10 ENCOUNTER — Other Ambulatory Visit: Payer: Self-pay | Admitting: Physician Assistant

## 2022-11-11 ENCOUNTER — Encounter: Payer: Self-pay | Admitting: *Deleted

## 2022-11-11 NOTE — Telephone Encounter (Signed)
Last Fill: 08/11/2022  Labs: 05/13/2022  Uric Acid 4.6               ALT 42  Next Visit: 02/13/2023  Last Visit: 08/11/2022  DX: Idiopathic chronic gout of multiple sites without tophus   Current Dose per office note 08/11/2022: allopurinol 300 mg 1 tablet by mouth daily   Message sent to patient to advise he is due to update labs.   Okay to refill Allopurinol?

## 2022-11-17 DIAGNOSIS — Z96651 Presence of right artificial knee joint: Secondary | ICD-10-CM | POA: Diagnosis not present

## 2022-12-12 ENCOUNTER — Other Ambulatory Visit: Payer: Self-pay | Admitting: *Deleted

## 2022-12-12 MED ORDER — ALLOPURINOL 300 MG PO TABS: 300.0000 mg | ORAL_TABLET | Freq: Every day | ORAL | 0 refills | Status: DC

## 2022-12-12 NOTE — Telephone Encounter (Signed)
Patient called requesting a refill on Allopurinol.   Last Fill: 11/11/2022   Labs: 05/13/2022  Uric Acid 4.6               ALT 42   Next Visit: 02/13/2023   Last Visit: 08/11/2022   DX: Idiopathic chronic gout of multiple sites without tophus    Current Dose per office note 08/11/2022: allopurinol 300 mg 1 tablet by mouth daily    Patient states he recently had his labs updated with PCP. Patient is contacting them to have results faxed to our office.    Okay to refill Allopurinol?

## 2022-12-15 ENCOUNTER — Ambulatory Visit (AMBULATORY_SURGERY_CENTER): Payer: Self-pay | Admitting: *Deleted

## 2022-12-15 VITALS — Ht 69.0 in | Wt 225.0 lb

## 2022-12-15 DIAGNOSIS — Z800 Family history of malignant neoplasm of digestive organs: Secondary | ICD-10-CM

## 2022-12-15 DIAGNOSIS — Z86010 Personal history of colonic polyps: Secondary | ICD-10-CM

## 2022-12-15 MED ORDER — NA SULFATE-K SULFATE-MG SULF 17.5-3.13-1.6 GM/177ML PO SOLN: 1.0000 | Freq: Once | ORAL | 0 refills | Status: AC

## 2022-12-15 NOTE — Progress Notes (Signed)
Pt's name and DOB verified at the beginning of the pre-visit.  Pt denies any difficulty with ambulating,sitting, laying down or rolling side to side Gave both [facility] main # and MD on call # prior to instructions.  No egg or soy allergy known to patient  No issues known to pt with past sedation with any surgeries or procedures Pt denies having issues being intubated Pt has no issues moving head neck or swallowing No FH of Malignant Hyperthermia Pt is not on diet pills Pt is not on home 02  Pt is not on blood thinners  Pt denies issues with constipation  Pt is not on dialysis Pt denise any abnormal heart rhythms  Pt denies any upcoming cardiac testing Pt encouraged to use to use Singlecare or Goodrx to reduce cost  Patient's chart reviewed by Dennis Oliver CNRA prior to pre-visit and patient appropriate for the [facility].  Pre-visit completed and red dot placed by patient's name on their procedure day (on provider's schedule).  . Visit by phone Pt states weight is 225 lb Instructed pt why it is important to and  to call if they have any changes in health or new medications. Directed them to the # given and on instructions.   Pt states they will.  Instructions reviewed with pt and pt states understanding. Instructed to review again prior to procedure. Pt states they will.  Instructions sent by mail with coupon and by my chart   

## 2022-12-17 ENCOUNTER — Encounter: Payer: Self-pay | Admitting: *Deleted

## 2022-12-17 ENCOUNTER — Telehealth: Payer: Self-pay | Admitting: *Deleted

## 2022-12-17 NOTE — Telephone Encounter (Signed)
Labs received from:[facility]  Drawn on:09/11/2022  Reviewed by:Sherron Ales, PA-C  Labs drawn: CMP, CBC, Lipid Panel, Uric Acid, TSH, PSA, Hgb A1C, Testosterone  Results: ALT 52     LYM 0.5    LYM% 8.8    RDW 11.9    Neu % 78.1    Free Testerone 5.1  Patient is on Allopurinol 300 mg po daily.

## 2022-12-23 ENCOUNTER — Telehealth: Payer: Self-pay | Admitting: Internal Medicine

## 2022-12-23 ENCOUNTER — Encounter: Payer: Self-pay | Admitting: Internal Medicine

## 2022-12-23 NOTE — Telephone Encounter (Signed)
Informed pt that new instructions for Miralax has been sent to My Chart and reviewed where to look for instructions in detail on that site. Pt states he will look and will call if he has any questions.

## 2022-12-23 NOTE — Telephone Encounter (Signed)
Inbound call from patient, states he would like to change prep to the Miralax and Dulcolax prep. States he has done it in the past, and would like to proceed with that. Patient has procedure on 6/4.

## 2022-12-30 ENCOUNTER — Ambulatory Visit (AMBULATORY_SURGERY_CENTER): Payer: BC Managed Care – PPO | Admitting: Internal Medicine

## 2022-12-30 ENCOUNTER — Encounter: Payer: Self-pay | Admitting: Internal Medicine

## 2022-12-30 VITALS — BP 148/82 | HR 49 | Temp 98.0°F | Resp 10 | Ht 69.0 in | Wt 225.0 lb

## 2022-12-30 DIAGNOSIS — D122 Benign neoplasm of ascending colon: Secondary | ICD-10-CM | POA: Diagnosis not present

## 2022-12-30 DIAGNOSIS — Z86010 Personal history of colonic polyps: Secondary | ICD-10-CM

## 2022-12-30 DIAGNOSIS — D125 Benign neoplasm of sigmoid colon: Secondary | ICD-10-CM

## 2022-12-30 DIAGNOSIS — Z1211 Encounter for screening for malignant neoplasm of colon: Secondary | ICD-10-CM | POA: Diagnosis not present

## 2022-12-30 DIAGNOSIS — Z09 Encounter for follow-up examination after completed treatment for conditions other than malignant neoplasm: Secondary | ICD-10-CM | POA: Diagnosis not present

## 2022-12-30 DIAGNOSIS — Z800 Family history of malignant neoplasm of digestive organs: Secondary | ICD-10-CM | POA: Insufficient documentation

## 2022-12-30 MED ORDER — SODIUM CHLORIDE 0.9 % IV SOLN: 500.0000 mL | Freq: Once | INTRAVENOUS | Status: DC

## 2022-12-30 NOTE — Progress Notes (Signed)
Uneventful anesthetic. Report to pacu rn. Vss. Care resumed by rn. 

## 2022-12-30 NOTE — Op Note (Signed)
[facility] Patient Name: Dennis Oliver Procedure Date: 12/30/2022 8:56 AM MRN: [id number] Endoscopist: Iva Boop , MD, [id number] Age: 62 Referring MD:  Date of Birth: [birth year] Gender: Male Account #: [id number] Procedure:                Colonoscopy Indications:              Surveillance: History of numerous (> 10) adenomas                            on last colonoscopy (< 3 yrs) Medicines:                Monitored Anesthesia Care Procedure:                Pre-Anesthesia Assessment:                           - Prior to the procedure, a History and Physical                            was performed, and patient medications and                            allergies were reviewed. The patient's tolerance of                            previous anesthesia was also reviewed. The risks                            and benefits of the procedure and the sedation                            options and risks were discussed with the patient.                            All questions were answered, and informed consent                            was obtained. Prior Anticoagulants: The patient has                            taken no anticoagulant or antiplatelet agents. ASA                            Grade Assessment: II - A patient with mild systemic                            disease. After reviewing the risks and benefits,                            the patient was deemed in satisfactory condition to                            undergo the procedure.  After obtaining informed consent, the colonoscope                            was passed under direct vision. Throughout the                            procedure, the patient's blood pressure, pulse, and                            oxygen saturations were monitored continuously. The                            CF [id number] #[id number] was introduced through the anus                            and advanced to the the cecum,  identified by                            appendiceal orifice and ileocecal valve. The                            colonoscopy was performed without difficulty. The                            patient tolerated the procedure well. The quality                            of the bowel preparation was good. The ileocecal                            valve, appendiceal orifice, and rectum were                            photographed. The bowel preparation used was                            Miralax via extended prep with split dose                            instruction. Scope In: 9:11:24 AM Scope Out: 9:26:45 AM Scope Withdrawal Time: 0 hours 12 minutes 50 seconds  Total Procedure Duration: 0 hours 15 minutes 21 seconds  Findings:                 The perianal and digital rectal examinations were                            normal. Pertinent negatives include normal prostate                            (size, shape, and consistency).                           Two sessile polyps were found in the sigmoid colon  and ascending colon. The polyps were diminutive in                            size. These polyps were removed with a cold snare.                            Resection and retrieval were complete. Verification                            of patient identification for the specimen was                            done. Estimated blood loss was minimal.                           Multiple diverticula were found in the sigmoid                            colon.                           Internal hemorrhoids were found.                           A post-hemorrhoid ligation scar was found in the                            rectum.                           The exam was otherwise without abnormality on                            direct and retroflexion views. Complications:            No immediate complications. Estimated Blood Loss:     Estimated blood loss was  minimal. Impression:               - Two diminutive polyps in the sigmoid colon and in                            the ascending colon, removed with a cold snare.                            Resected and retrieved.                           - Diverticulosis in the sigmoid colon.                           - Internal hemorrhoids + post-ligation scars in                            rectum                           - The  examination was otherwise normal on direct                            and retroflexion views.                           - Personal history of colonic polyps and family                            history of colon cancer mother in 74's                           2001 1 adenoma                           2012 1 adenoma                           10/30/2017 5 adenomas                           10/17/2021 10 polyps max 6 mm-all adenomas Recommendation:           - Patient has a contact number available for                            emergencies. The signs and symptoms of potential                            delayed complications were discussed with the                            patient. Return to normal activities tomorrow.                            Written discharge instructions were provided to the                            patient.                           - Resume previous diet.                           - Continue present medications.                           - Await pathology results.                           - Repeat colonoscopy is recommended for                            surveillance. The colonoscopy date will be                            determined after pathology results from today's  exam become available for review.                           - OFFICE WILL REFER FOR GENETIC TESTING: NUMEROUS                            ADENOMAS AND FAMILY HISTORY OF COLON CANCER Iva Boop, MD 12/30/2022 9:36:32 AM This report has been signed  electronically.

## 2022-12-30 NOTE — Progress Notes (Signed)
Called to room to assist during endoscopic procedure.  Patient ID and intended procedure confirmed with present staff. Received instructions for my participation in the procedure from the performing physician.  

## 2022-12-30 NOTE — Progress Notes (Signed)
Pt's states no medical or surgical changes since previsit or office visit. 

## 2022-12-30 NOTE — Patient Instructions (Addendum)
Thank you for letting us take care of your healthcare needs today. Please see handouts given to you on Polyps, Diverticulosis and Hemorrhoids.    YOU HAD AN ENDOSCOPIC PROCEDURE TODAY AT THE [facility]:   Refer to the procedure report that was given to you for any specific questions about what was found during the examination.  If the procedure report does not answer your questions, please call your gastroenterologist to clarify.  If you requested that your care partner not be given the details of your procedure findings, then the procedure report has been included in a sealed envelope for you to review at your convenience later.  YOU SHOULD EXPECT: Some feelings of bloating in the abdomen. Passage of more gas than usual.  Walking can help get rid of the air that was put into your GI tract during the procedure and reduce the bloating. If you had a lower endoscopy (such as a colonoscopy or flexible sigmoidoscopy) you may notice spotting of blood in your stool or on the toilet paper. If you underwent a bowel prep for your procedure, you may not have a normal bowel movement for a few days.  Please Note:  You might notice some irritation and congestion in your nose or some drainage.  This is from the oxygen used during your procedure.  There is no need for concern and it should clear up in a day or so.  SYMPTOMS TO REPORT IMMEDIATELY:  Following lower endoscopy (colonoscopy or flexible sigmoidoscopy):  Excessive amounts of blood in the stool  Significant tenderness or worsening of abdominal pains  Swelling of the abdomen that is new, acute  Fever of 100F or higher  For urgent or emergent issues, a gastroenterologist can be reached at any hour by calling [phone]. Do not use MyChart messaging for urgent concerns.    DIET:  We do recommend a small meal at first, but then you may proceed to your regular diet.  Drink plenty of fluids but you should avoid alcoholic beverages  for 24 hours.  ACTIVITY:  You should plan to take it easy for the rest of today and you should NOT DRIVE or use heavy machinery until tomorrow (because of the sedation medicines used during the test).    FOLLOW UP: Our staff will call the number listed on your records the next business day following your procedure.  We will call around 7:15- 8:00 am to check on you and address any questions or concerns that you may have regarding the information given to you following your procedure. If we do not reach you, we will leave a message.     If any biopsies were taken you will be contacted by phone or by letter within the next 1-3 weeks.  Please call us at [phone] if you have not heard about the biopsies in 3 weeks.    SIGNATURES/CONFIDENTIALITY: You and/or your care partner have signed paperwork which will be entered into your electronic medical record.  These signatures attest to the fact that that the information above on your After Visit Summary has been reviewed and is understood.  Full responsibility of the confidentiality of this discharge information lies with you and/or your care-partner.Two tiny polyps removed today.  Also saw diverticulosis and hemorrhoids.  I will let you know pathology results and when to have another routine colonoscopy by mail and/or My Chart.  Given your polyp history and family history of colon cancer I will refer you for genetic  screening and testing. Stay tuned - my office staff will make a referral. This will help [place] make better decisions about your and your family's future testing.  I appreciate the opportunity to care for you. Iva Boop, MD, [facility]

## 2022-12-30 NOTE — Progress Notes (Signed)
[facility] History and Physical   Primary Care Physician:  Melida Quitter, MD   Reason for Procedure:   Hx colon polyps  Plan:    colonoscopy     HPI: Dennis Oliver is a 62 y.o. male w/ hx polyps as below - for surveillance exam - also FHx CRCA mom in 89's  2001 1 adenoma 2012 1 adenoma 10/30/2017 5 adenomas 10/17/2021 10 polyps max 6 mm-all adenomas recall 1 year 2024 Past Medical History:  Diagnosis Date   Allergy    Anemia    Asthma    Blood transfusion without reported diagnosis    Cancer (HCC)    melanoma back   Diabetes mellitus without complication (HCC)    pre-diabetci   DISLOCATION CLOSED INTERPHALANGEAL HAND 03/26/2009   Qualifier: Diagnosis of  By: Romeo Apple MD, [place]     Diverticulosis    H/O: gout    Hx of adenomatous colonic polyps 10/17/1999   Internal hemorrhoids    Kidney stone    Neuromuscular disorder (HCC)    Septic arthritis of knee, right (HCC) 10/29/2010   Tubular adenoma     Past Surgical History:  Procedure Laterality Date   COLONOSCOPY     HEMORRHOID BANDING  2016   KIDNEY STONE SURGERY     KNEE ARTHROSCOPY  FEB THEN 10/01/2010   right knee; HAD INFECTION REPEAT ARTHROSCOPY   KNEE ARTHROSCOPY W/ ACL RECONSTRUCTION  20 YEARS AGO   REPLACEMENT TOTAL KNEE Right 06/04/2022   VASECTOMY  2016    Prior to Admission medications   Medication Sig Start Date End Date Taking? Authorizing Provider  allopurinol (ZYLOPRIM) 300 MG tablet Take 1 tablet (300 mg total) by mouth daily. 12/12/22  Yes Gearldine Bienenstock, PA-C  amoxicillin (AMOXIL) 500 MG tablet TAKE 4 TABLETS BY MOUTH 1 HOUR BEFORE PROCEDURE   Yes [provider]  fexofenadine (ALLEGRA) 180 MG tablet Take 180 mg by mouth daily.   Yes [provider]  metFORMIN (GLUCOPHAGE-XR) 750 MG 24 hr tablet daily. 08/24/19  Yes [provider]  montelukast (SINGULAIR) 10 MG tablet Take 1 tablet by mouth daily. 01/11/20  Yes [provider]  Multiple  Vitamin (MULTIVITAMIN PO) Take by mouth daily.   Yes [provider]  PROAIR HFA 108 (90 BASE) MCG/ACT inhaler Inhale 1-2 puffs into the lungs every 4 (four) hours as needed. 10/04/14  Yes [provider]  rosuvastatin (CRESTOR) 20 MG tablet Take 20 mg by mouth. EOD 08/24/19  Yes [provider]  ASPIRIN 81 PO Take 81 mg by mouth daily. Patient not taking: Reported on 12/15/2022    [provider]  AXIRON 30 MG/ACT SOLN Apply 30 mg topically as needed. Patient not taking: Reported on 10/17/2021 09/01/14   [provider]  BREO ELLIPTA 100-25 MCG/INH AEPB Take 1 puff by mouth daily.  09/01/14   [provider]  buPROPion (WELLBUTRIN XL) 150 MG 24 hr tablet Take 150 mg by mouth every morning. Patient not taking: Reported on 12/15/2022    [provider]  Coenzyme Q10 100 MG capsule Take 1 tablet by mouth daily. Patient not taking: Reported on 10/17/2021 01/11/20   [provider]  colchicine 0.6 MG tablet Take 1 tablet (0.6 mg total) by mouth daily as needed. 04/02/18   Gearldine Bienenstock, PA-C  sildenafil (VIAGRA) 25 MG tablet Take 25 mg by mouth daily as needed for erectile dysfunction.    [provider]  VITAMIN D PO Take by  mouth daily. Patient not taking: Reported on 09/23/2021    [provider]    Current Outpatient Medications  Medication Sig Dispense Refill   allopurinol (ZYLOPRIM) 300 MG tablet Take 1 tablet (300 mg total) by mouth daily. 30 tablet 0   amoxicillin (AMOXIL) 500 MG tablet TAKE 4 TABLETS BY MOUTH 1 HOUR BEFORE PROCEDURE     fexofenadine (ALLEGRA) 180 MG tablet Take 180 mg by mouth daily.     metFORMIN (GLUCOPHAGE-XR) 750 MG 24 hr tablet daily.     montelukast (SINGULAIR) 10 MG tablet Take 1 tablet by mouth daily.     Multiple Vitamin (MULTIVITAMIN PO) Take by mouth daily.     PROAIR HFA 108 (90 BASE) MCG/ACT inhaler Inhale 1-2 puffs into the lungs every 4 (four) hours as needed.  5   rosuvastatin  (CRESTOR) 20 MG tablet Take 20 mg by mouth. EOD     ASPIRIN 81 PO Take 81 mg by mouth daily. (Patient not taking: Reported on 12/15/2022)     AXIRON 30 MG/ACT SOLN Apply 30 mg topically as needed. (Patient not taking: Reported on 10/17/2021)  5   BREO ELLIPTA 100-25 MCG/INH AEPB Take 1 puff by mouth daily.   5   buPROPion (WELLBUTRIN XL) 150 MG 24 hr tablet Take 150 mg by mouth every morning. (Patient not taking: Reported on 12/15/2022)     Coenzyme Q10 100 MG capsule Take 1 tablet by mouth daily. (Patient not taking: Reported on 10/17/2021)     colchicine 0.6 MG tablet Take 1 tablet (0.6 mg total) by mouth daily as needed. 30 tablet 2   sildenafil (VIAGRA) 25 MG tablet Take 25 mg by mouth daily as needed for erectile dysfunction.     VITAMIN D PO Take by mouth daily. (Patient not taking: Reported on 09/23/2021)     Current Facility-Administered Medications  Medication Dose Route Frequency Provider Last Rate Last Admin   0.9 %  sodium chloride infusion  500 mL Intravenous Once Iva Boop, MD        Allergies as of 12/30/2022 - Review Complete 12/30/2022  Allergen Reaction Noted   Shellfish allergy Hives 02/11/2016    Family History  Problem Relation Age of Onset   Colon cancer Mother 46   Bladder Cancer Father    Heart attack Father    Healthy Brother    Healthy Brother    Healthy Son    Colon polyps Neg Hx    Esophageal cancer Neg Hx    Rectal cancer Neg Hx    Stomach cancer Neg Hx     Social History   Socioeconomic History   Marital status: Married    Spouse name: Not on file   Number of children: Not on file   Years of education: Not on file   Highest education level: Not on file  Occupational History   Not on file  Tobacco Use   Smoking status: Never    Passive exposure: Past   Smokeless tobacco: Never  Vaping Use   Vaping Use: Never used  Substance and Sexual Activity   Alcohol use: Yes    Alcohol/week: 1.0 standard drink of alcohol    Types: 1 Glasses of  wine per week    Comment: occ   Drug use: No   Sexual activity: Not on file  Other Topics Concern   Not on file  Social History Narrative   Not on file   Social Determinants of Health   Financial Resource Strain:  Not on file  Food Insecurity: Not on file  Transportation Needs: Not on file  Physical Activity: Not on file  Stress: Not on file  Social Connections: Not on file  Intimate Partner Violence: Not on file    Review of Systems:  All other review of systems negative except as mentioned in the HPI.  Physical Exam: Vital signs BP (!) 161/59   Pulse (!) 52   Temp 98 F (36.7 C)   Ht 5\' 9"  (1.753 m)   Wt 225 lb (102.1 kg)   SpO2 97%   BMI 33.23 kg/m   General:   Alert,  Well-developed, well-nourished, pleasant and cooperative in NAD Lungs:  Clear throughout to auscultation.   Heart:  Regular rate and rhythm; no murmurs, clicks, rubs,  or gallops. Abdomen:  Soft, nontender and nondistended. Normal bowel sounds.   Neuro/Psych:  Alert and cooperative. Normal mood and affect. A and O x 3   @Briget Shaheed  Sena Slate, MD, [facility] [phone] (pager) 12/30/2022 8:59 AM@

## 2022-12-31 ENCOUNTER — Telehealth: Payer: Self-pay | Admitting: *Deleted

## 2022-12-31 NOTE — Telephone Encounter (Signed)
  Follow up Call-     12/30/2022    8:10 AM 10/17/2021    7:20 AM  Call back number  Post procedure Call Back phone  # [phone]  Permission to leave phone message Yes Yes     Patient questions:  Do you have a fever, pain , or abdominal swelling? No. Pain Score  0 *  Have you tolerated food without any problems? Yes.    Have you been able to return to your normal activities? Yes.    Do you have any questions about your discharge instructions: Diet   No. Medications  No. Follow up visit  No.  Do you have questions or concerns about your Care? No.  Actions: * If pain score is 4 or above: No action needed, pain <4.

## 2022-12-31 NOTE — Telephone Encounter (Signed)
Attempted to call patient for their post-procedure follow-up call. No answer. Left voicemail.   

## 2023-01-12 ENCOUNTER — Encounter: Payer: Self-pay | Admitting: Internal Medicine

## 2023-01-12 DIAGNOSIS — Z86010 Personal history of colonic polyps: Secondary | ICD-10-CM

## 2023-01-15 ENCOUNTER — Other Ambulatory Visit: Payer: Self-pay | Admitting: *Deleted

## 2023-01-15 MED ORDER — ALLOPURINOL 300 MG PO TABS: 300.0000 mg | ORAL_TABLET | Freq: Every day | ORAL | 0 refills | Status: DC

## 2023-01-15 NOTE — Telephone Encounter (Signed)
Patient contacted the office to request a refill on Allopurinol  Last Fill: 12/12/2022 (30 day supply)  Labs: 09/11/2022 ALT 52                LYM 0.5               LYM% 8.8               RDW 11.9               Neu % 78.1               Free Testerone 5.1  Next Visit: 02/13/2023  Last Visit: 08/11/2022  DX: Idiopathic chronic gout of multiple sites without tophus   Current Dose per office note 08/11/2022: allopurinol 300 mg 1 tablet by mouth daily   Okay to refill Allopurinol?

## 2023-02-02 NOTE — Progress Notes (Deleted)
Office Visit Note  Patient: Dennis Oliver             Date of Birth: August 28, 1960           MRN: [id number]             PCP: Melida Quitter, MD Referring: Melida Quitter, MD Visit Date: 02/13/2023 Occupation: @GUAROCC @  Subjective:  No chief complaint on file.   History of Present Illness: Dennis Oliver is a 62 y.o. male ***     Activities of Daily Living:  Patient reports morning stiffness for *** {minute/hour:19697}.   Patient {ACTIONS;DENIES/REPORTS:21021675::"Denies"} nocturnal pain.  Difficulty dressing/grooming: {ACTIONS;DENIES/REPORTS:21021675::"Denies"} Difficulty climbing stairs: {ACTIONS;DENIES/REPORTS:21021675::"Denies"} Difficulty getting out of chair: {ACTIONS;DENIES/REPORTS:21021675::"Denies"} Difficulty using hands for taps, buttons, cutlery, and/or writing: {ACTIONS;DENIES/REPORTS:21021675::"Denies"}  No Rheumatology ROS completed.   PMFS History:  Patient Active Problem List   Diagnosis Date Noted   Family history of malignant neoplasm of gastrointestinal tract 12/30/2022   Chronic maxillary sinusitis 08/05/2022   Mass of left ear canal 08/05/2022   Sudden left hearing loss 08/05/2022   Stiffness of left knee 06/06/2022   Stiffness of right knee 06/06/2022   Pain in joint of left knee 06/06/2022   Moderate recurrent major depression (HCC) 08/22/2021   History of urinary stone 01/11/2020   Erectile dysfunction due to arterial insufficiency 01/11/2020   Hyperlipidemia 01/11/2020   Metabolic syndrome 01/11/2020   Panic disorder 01/11/2020   Personal history of malignant melanoma of skin 01/11/2020   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   Osteoarthritis of right knee 08/28/2017   Idiopathic chronic gout of multiple sites without tophus 09/04/2016   Primary osteoarthritis of both knees 09/04/2016   Anal skin tag 01/08/2015   Gout 10/29/2010   Asthma 10/29/2010   Allergic rhinitis 10/29/2010   Nephrolithiasis 10/29/2010   Hemorrhoids, internal,  with bleeding 10/29/2010   ASTHMA 03/26/2009   Hx of adenomatous colonic polyps + FHx CRCA 10/17/1999    Past Medical History:  Diagnosis Date   Allergy    Anemia    Asthma    Blood transfusion without reported diagnosis    Cancer (HCC)    melanoma back   Diabetes mellitus without complication (HCC)    pre-diabetci   DISLOCATION CLOSED INTERPHALANGEAL HAND 03/26/2009   Qualifier: Diagnosis of  By: Romeo Apple MD, [place]     Diverticulosis    H/O: gout    Hx of adenomatous colonic polyps 10/17/1999   Internal hemorrhoids    Kidney stone    Neuromuscular disorder (HCC)    Septic arthritis of knee, right (HCC) 10/29/2010   Tubular adenoma     Family History  Problem Relation Age of Onset   Colon cancer Mother 13   Bladder Cancer Father    Heart attack Father    Healthy Brother    Healthy Brother    Healthy Son    Colon polyps Neg Hx    Esophageal cancer Neg Hx    Rectal cancer Neg Hx    Stomach cancer Neg Hx    Past Surgical History:  Procedure Laterality Date   COLONOSCOPY     HEMORRHOID BANDING  2016   KIDNEY STONE SURGERY     KNEE ARTHROSCOPY  FEB THEN 10/01/2010   right knee; HAD INFECTION REPEAT ARTHROSCOPY   KNEE ARTHROSCOPY W/ ACL RECONSTRUCTION  20 YEARS AGO   REPLACEMENT TOTAL KNEE Right 06/04/2022   VASECTOMY  2016   Social History   Social History Narrative   Not on file  Immunization History  Administered Date(s) Administered   Moderna Sars-Covid-2 Vaccination 09/29/2019, 11/01/2019, 07/13/2020     Objective: Vital Signs: There were no vitals taken for this visit.   Physical Exam   Musculoskeletal Exam: ***  CDAI Exam: CDAI Score: -- Patient Global: --; Provider Global: -- Swollen: --; Tender: -- Joint Exam 02/13/2023   No joint exam has been documented for this visit   There is currently no information documented on the homunculus. Go to the Rheumatology activity and complete the homunculus joint exam.  Investigation: No additional  findings.  Imaging: No results found.  Recent Labs: Lab Results  Component Value Date   WBC 5.3 09/11/2022   HGB 15.4 04/10/2021   PLT 229 04/10/2021   NA 139 09/11/2022   K 4.9 09/11/2022   CL 107 09/11/2022   CO2 29 (A) 09/11/2022   GLUCOSE 97 04/10/2021   BUN 11 09/11/2022   CREATININE 1.1 09/11/2022   BILITOT 0.4 04/10/2021   ALKPHOS 106 09/11/2022   AST 36 09/11/2022   ALT 52 (A) 09/11/2022   PROT 7.4 04/10/2021   ALBUMIN 4.1 09/11/2022   CALCIUM 9.8 09/11/2022   GFRAA 94 08/27/2020    Speciality Comments: No specialty comments available.  Procedures:  No procedures performed Allergies: Shellfish allergy   Assessment / Plan:     Visit Diagnoses: No diagnosis found.  Orders: No orders of the defined types were placed in this encounter.  No orders of the defined types were placed in this encounter.   Face-to-face time spent with patient was *** minutes. Greater than 50% of time was spent in counseling and coordination of care.  Follow-Up Instructions: No follow-ups on file.   Ellen Henri, CMA  Note - This record has been created using Animal nutritionist.  Chart creation errors have been sought, but may not always  have been located. Such creation errors do not reflect on  the standard of medical care.

## 2023-02-03 DIAGNOSIS — Z85820 Personal history of malignant melanoma of skin: Secondary | ICD-10-CM | POA: Diagnosis not present

## 2023-02-03 DIAGNOSIS — D225 Melanocytic nevi of trunk: Secondary | ICD-10-CM | POA: Diagnosis not present

## 2023-02-03 DIAGNOSIS — Z08 Encounter for follow-up examination after completed treatment for malignant neoplasm: Secondary | ICD-10-CM | POA: Diagnosis not present

## 2023-02-03 DIAGNOSIS — Z1283 Encounter for screening for malignant neoplasm of skin: Secondary | ICD-10-CM | POA: Diagnosis not present

## 2023-02-03 DIAGNOSIS — C44719 Basal cell carcinoma of skin of left lower limb, including hip: Secondary | ICD-10-CM | POA: Diagnosis not present

## 2023-02-13 ENCOUNTER — Ambulatory Visit: Payer: BC Managed Care – PPO | Admitting: Rheumatology

## 2023-02-13 DIAGNOSIS — Z85820 Personal history of malignant melanoma of skin: Secondary | ICD-10-CM

## 2023-02-13 DIAGNOSIS — M1A09X0 Idiopathic chronic gout, multiple sites, without tophus (tophi): Secondary | ICD-10-CM

## 2023-02-13 DIAGNOSIS — Z5181 Encounter for therapeutic drug level monitoring: Secondary | ICD-10-CM

## 2023-02-13 DIAGNOSIS — Z8709 Personal history of other diseases of the respiratory system: Secondary | ICD-10-CM

## 2023-02-13 DIAGNOSIS — M7701 Medial epicondylitis, right elbow: Secondary | ICD-10-CM

## 2023-02-13 DIAGNOSIS — Z87442 Personal history of urinary calculi: Secondary | ICD-10-CM

## 2023-02-13 DIAGNOSIS — Z96651 Presence of right artificial knee joint: Secondary | ICD-10-CM

## 2023-02-13 DIAGNOSIS — M1712 Unilateral primary osteoarthritis, left knee: Secondary | ICD-10-CM

## 2023-03-16 DIAGNOSIS — X32XXXD Exposure to sunlight, subsequent encounter: Secondary | ICD-10-CM | POA: Diagnosis not present

## 2023-03-16 DIAGNOSIS — L570 Actinic keratosis: Secondary | ICD-10-CM | POA: Diagnosis not present

## 2023-03-16 DIAGNOSIS — Z85828 Personal history of other malignant neoplasm of skin: Secondary | ICD-10-CM | POA: Diagnosis not present

## 2023-03-16 DIAGNOSIS — Z08 Encounter for follow-up examination after completed treatment for malignant neoplasm: Secondary | ICD-10-CM | POA: Diagnosis not present

## 2023-03-16 DIAGNOSIS — L820 Inflamed seborrheic keratosis: Secondary | ICD-10-CM | POA: Diagnosis not present

## 2023-03-16 DIAGNOSIS — B078 Other viral warts: Secondary | ICD-10-CM | POA: Diagnosis not present

## 2023-04-21 ENCOUNTER — Other Ambulatory Visit: Payer: Self-pay | Admitting: Physician Assistant

## 2023-05-07 NOTE — Progress Notes (Deleted)
Office Visit Note  Patient: Dennis Oliver             Date of Birth: [birth year]           MRN: [id number]             PCP: Melida Quitter, MD Referring: Melida Quitter, MD Visit Date: 05/21/2023 Occupation: @GUAROCC @  Subjective:    History of Present Illness: Dennis Oliver is a 61 y.o. male with history of gout and osteoarthritis.  Patient remains on Allopurinol 300 mg 1 tablet by mouth daily. He takes colchicine 0.6 mg po PRN for gout flares.   Plan to update CBC, CMP, and uric acid today.    Activities of Daily Living:  Patient reports morning stiffness for *** {minute/hour:19697}.   Patient {ACTIONS;DENIES/REPORTS:21021675::"Denies"} nocturnal pain.  Difficulty dressing/grooming: {ACTIONS;DENIES/REPORTS:21021675::"Denies"} Difficulty climbing stairs: {ACTIONS;DENIES/REPORTS:21021675::"Denies"} Difficulty getting out of chair: {ACTIONS;DENIES/REPORTS:21021675::"Denies"} Difficulty using hands for taps, buttons, cutlery, and/or writing: {ACTIONS;DENIES/REPORTS:21021675::"Denies"}  No Rheumatology ROS completed.   PMFS History:  Patient Active Problem List   Diagnosis Date Noted   Family history of malignant neoplasm of gastrointestinal tract 12/30/2022   Chronic maxillary sinusitis 08/05/2022   Mass of left ear canal 08/05/2022   Sudden left hearing loss 08/05/2022   Stiffness of left knee 06/06/2022   Stiffness of right knee 06/06/2022   Pain in joint of left knee 06/06/2022   Moderate recurrent major depression (HCC) 08/22/2021   History of urinary stone 01/11/2020   Erectile dysfunction due to arterial insufficiency 01/11/2020   Hyperlipidemia 01/11/2020   Metabolic syndrome 01/11/2020   Panic disorder 01/11/2020   Personal history of malignant melanoma of skin 01/11/2020   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   Osteoarthritis of right knee 08/28/2017   Idiopathic chronic gout of multiple sites without tophus 09/04/2016   Primary osteoarthritis of both knees  09/04/2016   Anal skin tag 01/08/2015   Gout 10/29/2010   Asthma 10/29/2010   Allergic rhinitis 10/29/2010   Nephrolithiasis 10/29/2010   Hemorrhoids, internal, with bleeding 10/29/2010   ASTHMA 03/26/2009   Hx of adenomatous colonic polyps + FHx CRCA 10/17/1999    Past Medical History:  Diagnosis Date   Allergy    Anemia    Asthma    Blood transfusion without reported diagnosis    Cancer (HCC)    melanoma back   Diabetes mellitus without complication (HCC)    pre-diabetci   DISLOCATION CLOSED INTERPHALANGEAL HAND 03/26/2009   Qualifier: Diagnosis of  By: Romeo Apple MD, [place]     Diverticulosis    H/O: gout    Hx of adenomatous colonic polyps 10/17/1999   Internal hemorrhoids    Kidney stone    Neuromuscular disorder (HCC)    Septic arthritis of knee, right (HCC) 10/29/2010   Tubular adenoma     Family History  Problem Relation Age of Onset   Colon cancer Mother 73   Bladder Cancer Father    Heart attack Father    Healthy Brother    Healthy Brother    Healthy Son    Colon polyps Neg Hx    Esophageal cancer Neg Hx    Rectal cancer Neg Hx    Stomach cancer Neg Hx    Past Surgical History:  Procedure Laterality Date   COLONOSCOPY     HEMORRHOID BANDING  2016   KIDNEY STONE SURGERY     KNEE ARTHROSCOPY  FEB THEN 10/01/2010   right knee; HAD INFECTION REPEAT ARTHROSCOPY   KNEE ARTHROSCOPY W/ ACL  RECONSTRUCTION  20 YEARS AGO   REPLACEMENT TOTAL KNEE Right 06/04/2022   VASECTOMY  2016   Social History   Social History Narrative   Not on file   Immunization History  Administered Date(s) Administered   Moderna Sars-Covid-2 Vaccination 09/29/2019, 11/01/2019, 07/13/2020     Objective: Vital Signs: There were no vitals taken for this visit.   Physical Exam Vitals and nursing note reviewed.  Constitutional:      Appearance: He is well-developed.  HENT:     Head: Normocephalic and atraumatic.  Eyes:     Conjunctiva/sclera: Conjunctivae normal.      Pupils: Pupils are equal, round, and reactive to light.  Cardiovascular:     Rate and Rhythm: Normal rate and regular rhythm.     Heart sounds: Normal heart sounds.  Pulmonary:     Effort: Pulmonary effort is normal.     Breath sounds: Normal breath sounds.  Abdominal:     General: Bowel sounds are normal.     Palpations: Abdomen is soft.  Musculoskeletal:     Cervical back: Normal range of motion and neck supple.  Skin:    General: Skin is warm and dry.     Capillary Refill: Capillary refill takes less than 2 seconds.  Neurological:     Mental Status: He is alert and oriented to person, place, and time.  Psychiatric:        Behavior: Behavior normal.      Musculoskeletal Exam: ***  CDAI Exam: CDAI Score: -- Patient Global: --; Provider Global: -- Swollen: --; Tender: -- Joint Exam 05/21/2023   No joint exam has been documented for this visit   There is currently no information documented on the homunculus. Go to the Rheumatology activity and complete the homunculus joint exam.  Investigation: No additional findings.  Imaging: No results found.  Recent Labs: Lab Results  Component Value Date   WBC 5.3 09/11/2022   HGB 15.4 04/10/2021   PLT 229 04/10/2021   NA 139 09/11/2022   K 4.9 09/11/2022   CL 107 09/11/2022   CO2 29 (A) 09/11/2022   GLUCOSE 97 04/10/2021   BUN 11 09/11/2022   CREATININE 1.1 09/11/2022   BILITOT 0.4 04/10/2021   ALKPHOS 106 09/11/2022   AST 36 09/11/2022   ALT 52 (A) 09/11/2022   PROT 7.4 04/10/2021   ALBUMIN 4.1 09/11/2022   CALCIUM 9.8 09/11/2022   GFRAA 94 08/27/2020    Speciality Comments: No specialty comments available.  Procedures:  No procedures performed Allergies: Shellfish allergy   Assessment / Plan:     Visit Diagnoses: Idiopathic chronic gout of multiple sites without tophus  Medication monitoring encounter  Primary osteoarthritis of left knee  S/P total knee replacement, right  Medial epicondylitis of  right elbow  History of renal calculi  History of melanoma  History of asthma  Orders: No orders of the defined types were placed in this encounter.  No orders of the defined types were placed in this encounter.   Face-to-face time spent with patient was *** minutes. Greater than 50% of time was spent in counseling and coordination of care.  Follow-Up Instructions: No follow-ups on file.   Gearldine Bienenstock, PA-C  Note - This record has been created using Dragon software.  Chart creation errors have been sought, but may not always  have been located. Such creation errors do not reflect on  the standard of medical care.

## 2023-05-21 ENCOUNTER — Ambulatory Visit: Payer: BC Managed Care – PPO | Admitting: Physician Assistant

## 2023-05-21 DIAGNOSIS — Z5181 Encounter for therapeutic drug level monitoring: Secondary | ICD-10-CM

## 2023-05-21 DIAGNOSIS — M1A09X0 Idiopathic chronic gout, multiple sites, without tophus (tophi): Secondary | ICD-10-CM

## 2023-05-21 DIAGNOSIS — Z85820 Personal history of malignant melanoma of skin: Secondary | ICD-10-CM

## 2023-05-21 DIAGNOSIS — Z87442 Personal history of urinary calculi: Secondary | ICD-10-CM

## 2023-05-21 DIAGNOSIS — M1712 Unilateral primary osteoarthritis, left knee: Secondary | ICD-10-CM

## 2023-05-21 DIAGNOSIS — Z8709 Personal history of other diseases of the respiratory system: Secondary | ICD-10-CM

## 2023-05-21 DIAGNOSIS — Z96651 Presence of right artificial knee joint: Secondary | ICD-10-CM

## 2023-05-21 DIAGNOSIS — M7701 Medial epicondylitis, right elbow: Secondary | ICD-10-CM

## 2023-06-08 NOTE — Progress Notes (Signed)
Office Visit Note  Patient: Dennis Oliver             Date of Birth: [birth year]           MRN: [id number]             PCP: Melida Quitter, MD Referring: Melida Quitter, MD Visit Date: 06/22/2023 Occupation: @GUAROCC @  Subjective:  Medication monitoring   History of Present Illness: Dennis Oliver is a 62 y.o. male with history of gout and osteoarthritis.  Patient remains on  Allopurinol 300 mg 1 tablet by mouth daily.   She continues to tolerate allopurinol without any side effects.  He takes colchicine 0.6 mg po PRN for gout flares.  He has not needed to take colchicine recently.  He denies any signs or symptoms of a gout flare.  He denies any joint pain or joint swelling.  He denies any tophi.  He has been experiencing increased pain in the left foot consistent with plantar fasciitis.   He denies any new medical conditions.    Activities of Daily Living:  Patient reports morning stiffness for a few minutes.   Patient Denies nocturnal pain.  Difficulty dressing/grooming: Denies Difficulty climbing stairs: Denies Difficulty getting out of chair: Denies Difficulty using hands for taps, buttons, cutlery, and/or writing: Denies  Review of Systems  Constitutional:  Negative for fatigue.  HENT:  Negative for mouth sores and mouth dryness.   Eyes:  Negative for dryness.  Respiratory:  Negative for shortness of breath.   Cardiovascular:  Negative for chest pain and palpitations.  Gastrointestinal:  Negative for blood in stool, constipation and diarrhea.  Endocrine: Negative for increased urination.  Genitourinary:  Negative for involuntary urination.  Musculoskeletal:  Positive for morning stiffness. Negative for joint pain, gait problem, joint pain, joint swelling, myalgias, muscle weakness, muscle tenderness and myalgias.  Skin:  Negative for color change, rash, hair loss and sensitivity to sunlight.  Allergic/Immunologic: Negative for susceptible to infections.  Neurological:   Negative for dizziness and headaches.  Hematological:  Negative for swollen glands.  Psychiatric/Behavioral:  Negative for depressed mood and sleep disturbance. The patient is not nervous/anxious.     PMFS History:  Patient Active Problem List   Diagnosis Date Noted   Family history of malignant neoplasm of gastrointestinal tract 12/30/2022   Chronic maxillary sinusitis 08/05/2022   Mass of left ear canal 08/05/2022   Sudden left hearing loss 08/05/2022   Stiffness of left knee 06/06/2022   Stiffness of right knee 06/06/2022   Pain in joint of left knee 06/06/2022   Moderate recurrent major depression (HCC) 08/22/2021   History of urinary stone 01/11/2020   Erectile dysfunction due to arterial insufficiency 01/11/2020   Hyperlipidemia 01/11/2020   Metabolic syndrome 01/11/2020   Panic disorder 01/11/2020   Personal history of malignant melanoma of skin 01/11/2020   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   Osteoarthritis of right knee 08/28/2017   Idiopathic chronic gout of multiple sites without tophus 09/04/2016   Primary osteoarthritis of both knees 09/04/2016   Anal skin tag 01/08/2015   Gout 10/29/2010   Asthma 10/29/2010   Allergic rhinitis 10/29/2010   Nephrolithiasis 10/29/2010   Hemorrhoids, internal, with bleeding 10/29/2010   ASTHMA 03/26/2009   Hx of adenomatous colonic polyps + FHx CRCA 10/17/1999    Past Medical History:  Diagnosis Date   Allergy    Anemia    Asthma    Blood transfusion without reported diagnosis    Cancer (HCC)  melanoma back   Diabetes mellitus without complication (HCC)    pre-diabetci   DISLOCATION CLOSED INTERPHALANGEAL HAND 03/26/2009   Qualifier: Diagnosis of  By: Romeo Apple MD, Duffy Rhody     Diverticulosis    H/O: gout    Hx of adenomatous colonic polyps 10/17/1999   Internal hemorrhoids    Kidney stone    Neuromuscular disorder (HCC)    Septic arthritis of knee, right (HCC) 10/29/2010   Tubular adenoma     Family History   Problem Relation Age of Onset   Colon cancer Mother 4   Bladder Cancer Father    Heart attack Father    Healthy Brother    Healthy Brother    Healthy Son    Colon polyps Neg Hx    Esophageal cancer Neg Hx    Rectal cancer Neg Hx    Stomach cancer Neg Hx    Past Surgical History:  Procedure Laterality Date   COLONOSCOPY     HEMORRHOID BANDING  2016   KIDNEY STONE SURGERY     KNEE ARTHROSCOPY  FEB THEN 10/01/2010   right knee; HAD INFECTION REPEAT ARTHROSCOPY   KNEE ARTHROSCOPY W/ ACL RECONSTRUCTION  20 YEARS AGO   REPLACEMENT TOTAL KNEE Right 06/04/2022   VASECTOMY  2016   Social History   Social History Narrative   Not on file   Immunization History  Administered Date(s) Administered   Moderna Sars-Covid-2 Vaccination 09/29/2019, 11/01/2019, 07/13/2020     Objective: Vital Signs: BP 108/73 (BP Location: Left Arm, Patient Position: Sitting, Cuff Size: Normal)   Pulse (!) 56   Resp 17   Ht 5\' 9"  (1.753 m)   Wt 239 lb (108.4 kg)   BMI 35.29 kg/m    Physical Exam Vitals and nursing note reviewed.  Constitutional:      Appearance: He is well-developed.  HENT:     Head: Normocephalic and atraumatic.  Eyes:     Conjunctiva/sclera: Conjunctivae normal.     Pupils: Pupils are equal, round, and reactive to light.  Cardiovascular:     Rate and Rhythm: Normal rate and regular rhythm.     Heart sounds: Normal heart sounds.  Pulmonary:     Effort: Pulmonary effort is normal.     Breath sounds: Normal breath sounds.  Abdominal:     General: Bowel sounds are normal.     Palpations: Abdomen is soft.  Musculoskeletal:     Cervical back: Normal range of motion and neck supple.  Skin:    General: Skin is warm and dry.     Capillary Refill: Capillary refill takes less than 2 seconds.  Neurological:     Mental Status: He is alert and oriented to person, place, and time.  Psychiatric:        Behavior: Behavior normal.      Musculoskeletal Exam: C-spine, thoracic  spine, lumbar spine good range of motion.  Shoulder joints, elbow joints, wrist joints, MCPs, PIPs, DIPs have good range of motion with no synovitis.  Complete fist formation bilaterally.  Hip joints have good range of motion with no groin pain.  Right knee replacement has good range of motion with warmth but no effusion.  Left knee joint has good range of motion no warmth or effusion.  Ankle joints have good range of motion no tenderness or joint swelling.  Plantar fasciitis of the left foot.  No evidence of Achilles tendinitis.  CDAI Exam: CDAI Score: -- Patient Global: --; Provider Global: -- Swollen: --; Tender: --  Joint Exam 06/22/2023   No joint exam has been documented for this visit   There is currently no information documented on the homunculus. Go to the Rheumatology activity and complete the homunculus joint exam.  Investigation: No additional findings.  Imaging: No results found.  Recent Labs: Lab Results  Component Value Date   WBC 5.3 09/11/2022   HGB 15.4 04/10/2021   PLT 229 04/10/2021   NA 139 09/11/2022   K 4.9 09/11/2022   CL 107 09/11/2022   CO2 29 (A) 09/11/2022   GLUCOSE 97 04/10/2021   BUN 11 09/11/2022   CREATININE 1.1 09/11/2022   BILITOT 0.4 04/10/2021   ALKPHOS 106 09/11/2022   AST 36 09/11/2022   ALT 52 (A) 09/11/2022   PROT 7.4 04/10/2021   ALBUMIN 4.1 09/11/2022   CALCIUM 9.8 09/11/2022   GFRAA 94 08/27/2020    Speciality Comments: No specialty comments available.  Procedures:  No procedures performed Allergies: Shellfish allergy    Assessment / Plan:     Visit Diagnoses: Idiopathic chronic gout of multiple sites without tophus - He has not had any signs or symptoms of a gout flare.  No tophi.  No synovitis or joint effusion noted on examination today.  He has clinically been doing well taking allopurinol 300 mg 1 tablet by mouth daily.  He has a prescription for colchicine which she keeps on hand to take as needed for flares.  He has  not needed to take any colchicine recently.  Patient has requested a refill of allopurinol but does not require refill of colchicine at this time.  CBC, CMP, and uric acid level will be updated today.  He will remain on allopurinol as prescribed.  He was advised to notify us if he develops signs or symptoms of a flare.  He will follow-up in the office in 6 months or sooner if needed.  Plan: Uric acid, allopurinol (ZYLOPRIM) 300 MG tablet  Medication monitoring encounter - Allopurinol 300 mg 1 tablet by mouth daily.  He takes colchicine 0.6 mg po PRN for gout flares.  CBC and CMP updated on 09/11/22. Orders for CBC and CMP released today.   Uric acid level will also be repeated today. - Plan: CBC with Differential/Platelet, COMPLETE METABOLIC PANEL WITH GFR, Uric acid  Primary osteoarthritis of left knee: Good ROM with no warmth or effusion.  No warmth or effusion noted.    S/P total knee replacement, right: Doing well.  Good ROM-warmth but no effusion.   Plantar fasciitis of left foot: He has been experiencing some increased discomfort in the left foot due to underlying plantar fasciitis.  No evidence of Achilles tendinitis.  Patient plans on following up with podiatry if his symptoms persist or worsen.  Other medical conditions are listed as follows:   Medial epicondylitis of right elbow - He was evaluated by Dr. Romeo Apple on 08/26/2021.  He underwent a cortisone injection on 08/26/2021. Resolved.  No tenderness upon palpation today.    History of renal calculi: Under care of urology.   History of melanoma: Under the care of dermatology-routine visit last week.   History of asthma  Orders: Orders Placed This Encounter  Procedures   CBC with Differential/Platelet   COMPLETE METABOLIC PANEL WITH GFR   Uric acid   Meds ordered this encounter  Medications   allopurinol (ZYLOPRIM) 300 MG tablet    Sig: Take 1 tablet (300 mg total) by mouth daily.    Dispense:  90 tablet  Refill:  0     Follow-Up Instructions: Return in about 6 months (around 12/20/2023) for Gout, Osteoarthritis.   Gearldine Bienenstock, PA-C  Note - This record has been created using Dragon software.  Chart creation errors have been sought, but may not always  have been located. Such creation errors do not reflect on  the standard of medical care.

## 2023-06-11 DIAGNOSIS — L570 Actinic keratosis: Secondary | ICD-10-CM | POA: Diagnosis not present

## 2023-06-11 DIAGNOSIS — L820 Inflamed seborrheic keratosis: Secondary | ICD-10-CM | POA: Diagnosis not present

## 2023-06-11 DIAGNOSIS — Z125 Encounter for screening for malignant neoplasm of prostate: Secondary | ICD-10-CM | POA: Diagnosis not present

## 2023-06-11 DIAGNOSIS — Z08 Encounter for follow-up examination after completed treatment for malignant neoplasm: Secondary | ICD-10-CM | POA: Diagnosis not present

## 2023-06-11 DIAGNOSIS — X32XXXD Exposure to sunlight, subsequent encounter: Secondary | ICD-10-CM | POA: Diagnosis not present

## 2023-06-11 DIAGNOSIS — Z85828 Personal history of other malignant neoplasm of skin: Secondary | ICD-10-CM | POA: Diagnosis not present

## 2023-06-22 ENCOUNTER — Ambulatory Visit: Payer: BC Managed Care – PPO | Attending: Physician Assistant | Admitting: Physician Assistant

## 2023-06-22 ENCOUNTER — Encounter: Payer: Self-pay | Admitting: Physician Assistant

## 2023-06-22 VITALS — BP 108/73 | HR 56 | Resp 17 | Ht 69.0 in | Wt 239.0 lb

## 2023-06-22 DIAGNOSIS — Z5181 Encounter for therapeutic drug level monitoring: Secondary | ICD-10-CM | POA: Diagnosis not present

## 2023-06-22 DIAGNOSIS — Z8709 Personal history of other diseases of the respiratory system: Secondary | ICD-10-CM

## 2023-06-22 DIAGNOSIS — M722 Plantar fascial fibromatosis: Secondary | ICD-10-CM

## 2023-06-22 DIAGNOSIS — Z96651 Presence of right artificial knee joint: Secondary | ICD-10-CM | POA: Diagnosis not present

## 2023-06-22 DIAGNOSIS — N5201 Erectile dysfunction due to arterial insufficiency: Secondary | ICD-10-CM | POA: Diagnosis not present

## 2023-06-22 DIAGNOSIS — Z87442 Personal history of urinary calculi: Secondary | ICD-10-CM

## 2023-06-22 DIAGNOSIS — M1712 Unilateral primary osteoarthritis, left knee: Secondary | ICD-10-CM

## 2023-06-22 DIAGNOSIS — Z85820 Personal history of malignant melanoma of skin: Secondary | ICD-10-CM

## 2023-06-22 DIAGNOSIS — M7701 Medial epicondylitis, right elbow: Secondary | ICD-10-CM

## 2023-06-22 DIAGNOSIS — M1A09X0 Idiopathic chronic gout, multiple sites, without tophus (tophi): Secondary | ICD-10-CM

## 2023-06-22 MED ORDER — ALLOPURINOL 300 MG PO TABS: 300.0000 mg | ORAL_TABLET | Freq: Every day | ORAL | 0 refills | Status: DC

## 2023-06-23 LAB — COMPLETE METABOLIC PANEL WITH GFR
AG Ratio: 1.4 (calc) (ref 1.0–2.5)
ALT: 22 U/L (ref 9–46)
AST: 19 U/L (ref 10–35)
Albumin: 4.3 g/dL (ref 3.6–5.1)
Alkaline phosphatase (APISO): 83 U/L (ref 35–144)
BUN: 14 mg/dL (ref 7–25)
CO2: 28 mmol/L (ref 20–32)
Calcium: 10.1 mg/dL (ref 8.6–10.3)
Chloride: 104 mmol/L (ref 98–110)
Creat: 1.05 mg/dL (ref 0.70–1.35)
Globulin: 3 g/dL (ref 1.9–3.7)
Glucose, Bld: 77 mg/dL (ref 65–99)
Potassium: 4.6 mmol/L (ref 3.5–5.3)
Sodium: 140 mmol/L (ref 135–146)
Total Bilirubin: 0.4 mg/dL (ref 0.2–1.2)
Total Protein: 7.3 g/dL (ref 6.1–8.1)
eGFR: 80 mL/min/{1.73_m2} (ref >=60)

## 2023-06-23 LAB — CBC WITH DIFFERENTIAL/PLATELET
Absolute Lymphocytes: 2584 {cells}/uL (ref 850–3900)
Absolute Monocytes: 624 {cells}/uL (ref 200–950)
Basophils Absolute: 72 {cells}/uL (ref 0–200)
Basophils Relative: 0.9 %
Eosinophils Absolute: 208 {cells}/uL (ref 15–500)
Eosinophils Relative: 2.6 %
HCT: 42 % (ref 38.5–50.0)
Hemoglobin: 14.3 g/dL (ref 13.2–17.1)
MCH: 30.7 pg (ref 27.0–33.0)
MCHC: 34 g/dL (ref 32.0–36.0)
MCV: 90.1 fL (ref 80.0–100.0)
MPV: 11.1 fL (ref 7.5–12.5)
Monocytes Relative: 7.8 %
Neutro Abs: 4512 {cells}/uL (ref 1500–7800)
Neutrophils Relative %: 56.4 %
Platelets: 228 10*3/uL (ref 140–400)
RBC: 4.66 10*6/uL (ref 4.20–5.80)
RDW: 13.4 % (ref 11.0–15.0)
Total Lymphocyte: 32.3 %
WBC: 8 10*3/uL (ref 3.8–10.8)

## 2023-06-23 LAB — URIC ACID: Uric Acid, Serum: 4.4 mg/dL (ref 4.0–8.0)

## 2023-06-23 NOTE — Progress Notes (Signed)
CBC and CMP WNL Uric acid WNL--no change in medication recommended at this time.   Ok to send results to PCP as requested.

## 2023-08-12 DIAGNOSIS — J3081 Allergic rhinitis due to animal (cat) (dog) hair and dander: Secondary | ICD-10-CM | POA: Diagnosis not present

## 2023-08-12 DIAGNOSIS — J3089 Other allergic rhinitis: Secondary | ICD-10-CM | POA: Diagnosis not present

## 2023-08-12 DIAGNOSIS — J301 Allergic rhinitis due to pollen: Secondary | ICD-10-CM | POA: Diagnosis not present

## 2023-08-12 DIAGNOSIS — J302 Other seasonal allergic rhinitis: Secondary | ICD-10-CM | POA: Diagnosis not present

## 2023-09-11 ENCOUNTER — Other Ambulatory Visit: Payer: Self-pay | Admitting: Physician Assistant

## 2023-09-11 DIAGNOSIS — M1A09X0 Idiopathic chronic gout, multiple sites, without tophus (tophi): Secondary | ICD-10-CM

## 2023-09-11 NOTE — Telephone Encounter (Signed)
Please schedule patient a follow up visit. Patient due May 2025. Thanks!   Follow-Up Instructions: Return in about 6 months (around 12/20/2023) for Gout, Osteoarthritis.

## 2023-09-11 NOTE — Telephone Encounter (Signed)
Last Fill: 06/22/2023  Labs: 06/22/2023 CBC and CMP WNL Uric acid WNL  Next Visit: Due May 2025. Message sent to the front to schedule.   Last Visit: 06/22/2023  DX:  Idiopathic chronic gout of multiple sites without tophus   Current Dose per office note 06/22/2023: Allopurinol 300 mg 1 tablet by mouth daily.   Okay to refill Allopurinol?

## 2023-09-11 NOTE — Telephone Encounter (Signed)
Attempted to contact patient and left message to advise patient to call the office and schedule patient a follow up appointment.

## 2023-09-21 DIAGNOSIS — L820 Inflamed seborrheic keratosis: Secondary | ICD-10-CM | POA: Diagnosis not present

## 2023-09-21 DIAGNOSIS — L570 Actinic keratosis: Secondary | ICD-10-CM | POA: Diagnosis not present

## 2023-09-21 DIAGNOSIS — L821 Other seborrheic keratosis: Secondary | ICD-10-CM | POA: Diagnosis not present

## 2023-09-21 DIAGNOSIS — Z1283 Encounter for screening for malignant neoplasm of skin: Secondary | ICD-10-CM | POA: Diagnosis not present

## 2023-09-21 DIAGNOSIS — Z08 Encounter for follow-up examination after completed treatment for malignant neoplasm: Secondary | ICD-10-CM | POA: Diagnosis not present

## 2023-09-21 DIAGNOSIS — D225 Melanocytic nevi of trunk: Secondary | ICD-10-CM | POA: Diagnosis not present

## 2023-09-21 DIAGNOSIS — X32XXXD Exposure to sunlight, subsequent encounter: Secondary | ICD-10-CM | POA: Diagnosis not present

## 2023-09-21 DIAGNOSIS — Z85820 Personal history of malignant melanoma of skin: Secondary | ICD-10-CM | POA: Diagnosis not present

## 2023-09-22 DIAGNOSIS — M109 Gout, unspecified: Secondary | ICD-10-CM | POA: Diagnosis not present

## 2023-09-22 DIAGNOSIS — H903 Sensorineural hearing loss, bilateral: Secondary | ICD-10-CM | POA: Diagnosis not present

## 2023-09-22 DIAGNOSIS — E785 Hyperlipidemia, unspecified: Secondary | ICD-10-CM | POA: Diagnosis not present

## 2023-09-29 DIAGNOSIS — E669 Obesity, unspecified: Secondary | ICD-10-CM | POA: Diagnosis not present

## 2023-09-29 DIAGNOSIS — Z0000 Encounter for general adult medical examination without abnormal findings: Secondary | ICD-10-CM | POA: Diagnosis not present

## 2023-09-29 DIAGNOSIS — Z23 Encounter for immunization: Secondary | ICD-10-CM | POA: Diagnosis not present

## 2023-09-29 DIAGNOSIS — R55 Syncope and collapse: Secondary | ICD-10-CM | POA: Diagnosis not present

## 2023-09-29 DIAGNOSIS — Z1339 Encounter for screening examination for other mental health and behavioral disorders: Secondary | ICD-10-CM | POA: Diagnosis not present

## 2023-09-29 DIAGNOSIS — E8881 Metabolic syndrome: Secondary | ICD-10-CM | POA: Diagnosis not present

## 2023-09-29 DIAGNOSIS — Z1331 Encounter for screening for depression: Secondary | ICD-10-CM | POA: Diagnosis not present

## 2023-10-07 ENCOUNTER — Other Ambulatory Visit: Payer: Self-pay | Admitting: Internal Medicine

## 2023-10-07 DIAGNOSIS — E785 Hyperlipidemia, unspecified: Secondary | ICD-10-CM

## 2023-10-16 ENCOUNTER — Other Ambulatory Visit

## 2023-10-28 DIAGNOSIS — H903 Sensorineural hearing loss, bilateral: Secondary | ICD-10-CM | POA: Diagnosis not present

## 2023-12-08 DIAGNOSIS — L820 Inflamed seborrheic keratosis: Secondary | ICD-10-CM | POA: Diagnosis not present

## 2023-12-08 DIAGNOSIS — L821 Other seborrheic keratosis: Secondary | ICD-10-CM | POA: Diagnosis not present

## 2023-12-08 DIAGNOSIS — D485 Neoplasm of uncertain behavior of skin: Secondary | ICD-10-CM | POA: Diagnosis not present

## 2023-12-08 DIAGNOSIS — L859 Epidermal thickening, unspecified: Secondary | ICD-10-CM | POA: Diagnosis not present

## 2024-02-01 ENCOUNTER — Other Ambulatory Visit: Payer: Self-pay | Admitting: Physician Assistant

## 2024-02-01 DIAGNOSIS — M1A09X0 Idiopathic chronic gout, multiple sites, without tophus (tophi): Secondary | ICD-10-CM

## 2024-02-01 NOTE — Telephone Encounter (Addendum)
 Last Fill: 09/11/2023  Labs: 06/22/2023 CBC and CMP WNL  Uric acid WNL   Next Visit: Due May 2025. Message sent to the front to schedule.   Last Visit: 06/21/2024  DX: Idiopathic chronic gout of multiple sites without tophus   Current Dose per office note : Allopurinol  300 mg 1 tablet by mouth daily.   Left message to advise patient he is due to update labs and due for a follow up appointment.   Okay to refill Allopurinol ?

## 2024-02-05 ENCOUNTER — Ambulatory Visit
Admission: RE | Admit: 2024-02-05 | Discharge: 2024-02-05 | Disposition: A | Discharge status: Home / Self Care | Source: Ambulatory Visit | Attending: Internal Medicine | Admitting: Internal Medicine

## 2024-02-05 DIAGNOSIS — E785 Hyperlipidemia, unspecified: Secondary | ICD-10-CM

## 2024-02-09 DIAGNOSIS — L859 Epidermal thickening, unspecified: Secondary | ICD-10-CM | POA: Diagnosis not present

## 2024-03-11 DIAGNOSIS — C44722 Squamous cell carcinoma of skin of right lower limb, including hip: Secondary | ICD-10-CM | POA: Diagnosis not present

## 2024-03-11 DIAGNOSIS — L089 Local infection of the skin and subcutaneous tissue, unspecified: Secondary | ICD-10-CM | POA: Diagnosis not present

## 2024-03-14 ENCOUNTER — Other Ambulatory Visit: Payer: Self-pay

## 2024-03-14 DIAGNOSIS — Z5181 Encounter for therapeutic drug level monitoring: Secondary | ICD-10-CM

## 2024-03-14 DIAGNOSIS — M1A09X0 Idiopathic chronic gout, multiple sites, without tophus (tophi): Secondary | ICD-10-CM

## 2024-03-22 DIAGNOSIS — E785 Hyperlipidemia, unspecified: Secondary | ICD-10-CM | POA: Diagnosis not present

## 2024-03-24 ENCOUNTER — Ambulatory Visit: Payer: Self-pay | Admitting: Physician Assistant

## 2024-04-29 ENCOUNTER — Other Ambulatory Visit: Payer: Self-pay | Admitting: Internal Medicine

## 2024-04-29 DIAGNOSIS — R9389 Abnormal findings on diagnostic imaging of other specified body structures: Secondary | ICD-10-CM

## 2024-07-12 DIAGNOSIS — M79672 Pain in left foot: Secondary | ICD-10-CM | POA: Diagnosis not present

## 2024-07-25 DIAGNOSIS — Z08 Encounter for follow-up examination after completed treatment for malignant neoplasm: Secondary | ICD-10-CM | POA: Diagnosis not present

## 2024-07-25 DIAGNOSIS — Z1283 Encounter for screening for malignant neoplasm of skin: Secondary | ICD-10-CM | POA: Diagnosis not present

## 2024-07-25 DIAGNOSIS — B078 Other viral warts: Secondary | ICD-10-CM | POA: Diagnosis not present

## 2024-07-25 DIAGNOSIS — D225 Melanocytic nevi of trunk: Secondary | ICD-10-CM | POA: Diagnosis not present

## 2024-07-25 DIAGNOSIS — Z85820 Personal history of malignant melanoma of skin: Secondary | ICD-10-CM | POA: Diagnosis not present

## 2024-07-25 DIAGNOSIS — L820 Inflamed seborrheic keratosis: Secondary | ICD-10-CM | POA: Diagnosis not present
